# Patient Record
Sex: Male | Born: 1965 | Race: White | Hispanic: No | Marital: Married | Smoking: Former smoker
Health system: Southern US, Community
[De-identification: ages and names within clinical notes are randomized; demographics above are authoritative.]

## PROBLEM LIST (undated history)

## (undated) DIAGNOSIS — T7840XA Allergy, unspecified, initial encounter: Secondary | ICD-10-CM

## (undated) DIAGNOSIS — E785 Hyperlipidemia, unspecified: Secondary | ICD-10-CM

## (undated) DIAGNOSIS — G43909 Migraine, unspecified, not intractable, without status migrainosus: Secondary | ICD-10-CM

## (undated) DIAGNOSIS — J45998 Other asthma: Secondary | ICD-10-CM

## (undated) DIAGNOSIS — R51 Headache: Secondary | ICD-10-CM

## (undated) DIAGNOSIS — M5126 Other intervertebral disc displacement, lumbar region: Secondary | ICD-10-CM

## (undated) DIAGNOSIS — R519 Headache, unspecified: Secondary | ICD-10-CM

## (undated) HISTORY — PX: LASIK: SHX215

## (undated) HISTORY — DX: Migraine, unspecified, not intractable, without status migrainosus: G43.909

## (undated) HISTORY — DX: Headache: R51

## (undated) HISTORY — PX: SHOULDER SURGERY: SHX246

## (undated) HISTORY — PX: ACHILLES TENDON REPAIR: SUR1153

## (undated) HISTORY — DX: Hyperlipidemia, unspecified: E78.5

## (undated) HISTORY — DX: Headache, unspecified: R51.9

## (undated) HISTORY — DX: Allergy, unspecified, initial encounter: T78.40XA

## (undated) HISTORY — DX: Other intervertebral disc displacement, lumbar region: M51.26

---

## 1999-02-01 DIAGNOSIS — M5126 Other intervertebral disc displacement, lumbar region: Secondary | ICD-10-CM

## 1999-02-01 HISTORY — DX: Other intervertebral disc displacement, lumbar region: M51.26

## 2001-09-13 ENCOUNTER — Encounter: Admission: RE | Admit: 2001-09-13 | Discharge: 2001-09-13 | Payer: Self-pay | Admitting: Family Medicine

## 2001-09-13 ENCOUNTER — Encounter: Payer: Self-pay | Admitting: Family Medicine

## 2008-01-23 ENCOUNTER — Emergency Department (HOSPITAL_BASED_OUTPATIENT_CLINIC_OR_DEPARTMENT_OTHER): Admission: EM | Admit: 2008-01-23 | Discharge: 2008-01-23 | Payer: Self-pay | Admitting: Emergency Medicine

## 2008-01-23 ENCOUNTER — Ambulatory Visit: Payer: Self-pay | Admitting: Diagnostic Radiology

## 2012-12-08 ENCOUNTER — Emergency Department (HOSPITAL_BASED_OUTPATIENT_CLINIC_OR_DEPARTMENT_OTHER)
Admission: EM | Admit: 2012-12-08 | Discharge: 2012-12-08 | Disposition: A | Discharge status: Home / Self Care | Payer: BC Managed Care – PPO | Attending: Emergency Medicine | Admitting: Emergency Medicine

## 2012-12-08 ENCOUNTER — Emergency Department (HOSPITAL_BASED_OUTPATIENT_CLINIC_OR_DEPARTMENT_OTHER): Payer: BC Managed Care – PPO

## 2012-12-08 ENCOUNTER — Encounter (HOSPITAL_BASED_OUTPATIENT_CLINIC_OR_DEPARTMENT_OTHER): Payer: Self-pay | Admitting: Emergency Medicine

## 2012-12-08 DIAGNOSIS — R42 Dizziness and giddiness: Secondary | ICD-10-CM | POA: Insufficient documentation

## 2012-12-08 DIAGNOSIS — R079 Chest pain, unspecified: Secondary | ICD-10-CM

## 2012-12-08 DIAGNOSIS — J45909 Unspecified asthma, uncomplicated: Secondary | ICD-10-CM | POA: Insufficient documentation

## 2012-12-08 DIAGNOSIS — Z79899 Other long term (current) drug therapy: Secondary | ICD-10-CM | POA: Insufficient documentation

## 2012-12-08 DIAGNOSIS — R071 Chest pain on breathing: Secondary | ICD-10-CM | POA: Insufficient documentation

## 2012-12-08 HISTORY — DX: Other asthma: J45.998

## 2012-12-08 LAB — BASIC METABOLIC PANEL
BUN: 20 mg/dL (ref 6–23)
CO2: 25 mEq/L (ref 19–32)
Calcium: 9.7 mg/dL (ref 8.4–10.5)
Chloride: 102 mEq/L (ref 96–112)
Creatinine, Ser: 1.2 mg/dL (ref 0.50–1.35)
Sodium: 140 mEq/L (ref 135–145)

## 2012-12-08 LAB — CBC WITH DIFFERENTIAL/PLATELET
Basophils Relative: 0 % (ref 0–1)
Eosinophils Relative: 3 % (ref 0–5)
HCT: 44.7 % (ref 39.0–52.0)
Hemoglobin: 15.9 g/dL (ref 13.0–17.0)
Lymphocytes Relative: 32 % (ref 12–46)
MCH: 31.9 pg (ref 26.0–34.0)
MCHC: 35.6 g/dL (ref 30.0–36.0)
MCV: 89.6 fL (ref 78.0–100.0)
Monocytes Absolute: 0.7 10*3/uL (ref 0.1–1.0)
Monocytes Relative: 10 % (ref 3–12)
Neutro Abs: 4.1 10*3/uL (ref 1.7–7.7)
Platelets: 260 10*3/uL (ref 150–400)

## 2012-12-08 MED ORDER — IBUPROFEN 600 MG PO TABS: 600.0000 mg | ORAL_TABLET | Freq: Four times a day (QID) | ORAL | Status: DC | PRN

## 2012-12-08 MED ORDER — ASPIRIN 81 MG PO CHEW
162.0000 mg | CHEWABLE_TABLET | Freq: Once | ORAL | Status: AC
  Administered 2012-12-08: 162 mg via ORAL
  Filled 2012-12-08: qty 2

## 2012-12-08 MED ORDER — GI COCKTAIL ~~LOC~~
30.0000 mL | Freq: Once | ORAL | Status: AC
  Administered 2012-12-08: 30 mL via ORAL
  Filled 2012-12-08: qty 30

## 2012-12-08 NOTE — ED Notes (Signed)
Patient states he woke up around an hour ago with a sharp pain on L side of chest and SOB. Every time he takes a breath it hurts. States it feels like a pulled muscle

## 2012-12-08 NOTE — ED Provider Notes (Signed)
CSN: [id number]     Arrival date & time 12/08/12  4098 History   First MD Initiated Contact with Patient 12/08/12 [phone]     Chief Complaint  Patient presents with  . Chest Pain   (Consider location/radiation/quality/duration/timing/severity/associated sxs/prior Treatment) HPI Comments: Pt with no medical hx, surgical hx, substance abuse comes in with cc of chest pain. Pt woke up with a left sided chest pain, that is constant, and feels like a muscle pull, and is worse with inspiration. No n/dib/diophoresis/near syncope/syncope. Pain is non radiating. No trauma. No hx of PE, DVT and no risk factors for the same. No family hx of musculoskeletal disorders and no premature CAD.   Patient is a 47 y.o. male presenting with chest pain. The history is provided by the patient.  Chest Pain Associated symptoms: dizziness   Associated symptoms: no cough, no fever, no headache and no shortness of breath     Past Medical History  Diagnosis Date  . Seasonal asthma    Past Surgical History  Procedure Laterality Date  . Achilles tendon repair     History reviewed. No pertinent family history. History  Substance Use Topics  . Smoking status: Never Smoker   . Smokeless tobacco: Not on file  . Alcohol Use: No    Review of Systems  Constitutional: Negative for fever, chills and activity change.  Eyes: Negative for visual disturbance.  Respiratory: Negative for cough, chest tightness and shortness of breath.   Cardiovascular: Positive for chest pain.  Gastrointestinal: Negative for abdominal distention.  Genitourinary: Negative for dysuria, enuresis and difficulty urinating.  Musculoskeletal: Negative for arthralgias and neck pain.  Neurological: Positive for dizziness. Negative for light-headedness and headaches.  Psychiatric/Behavioral: Negative for confusion.    Allergies  Review of patient's allergies indicates no known allergies.  Home Medications   Current Outpatient Rx  Name  Route   Sig  Dispense  Refill  . ALBUTEROL IN   Inhalation   Inhale into the lungs as needed.         Marland Kitchen ibuprofen (ADVIL,MOTRIN) 200 MG tablet   Oral   Take 200 mg by mouth every 6 (six) hours as needed.         . SUMATRIPTAN SUCCINATE PO   Oral   Take by mouth as needed.          BP 137/86  Pulse 65  Temp(Src) 97.6 F (36.4 C)  Resp 18  SpO2 100% Physical Exam  Constitutional: He is oriented to person, place, and time. He appears well-developed.  HENT:  Head: Normocephalic and atraumatic.  Eyes: Conjunctivae and EOM are normal. Pupils are equal, round, and reactive to light.  Neck: Normal range of motion. Neck supple.  Cardiovascular: Normal rate and regular rhythm.   Pulmonary/Chest: Effort normal and breath sounds normal.  Pain worse with inspiration and palpation of the chest wall.  Abdominal: Soft. Bowel sounds are normal. He exhibits no distension. There is no tenderness. There is no rebound and no guarding.  Neurological: He is alert and oriented to person, place, and time.  Skin: Skin is warm.    ED Course  Procedures (including critical care time) Labs Review Labs Reviewed  CBC WITH DIFFERENTIAL  BASIC METABOLIC PANEL  TROPONIN I   Imaging Review No results found.  EKG Interpretation   None       MDM  No diagnosis found.  Pt comes in with cc of chest pain. Pt has 0 cardiac risk factors. Heart score to  be 1 if trops x 2 are negative - and so unless troponin is elevated, no concerns for immediate cardiac pathology and workup.  History  Slightly suspicious 0   ECG Normal 0   Age 61 - 65 years 1  Risk Factors  No risk factors known 0   Troponin  ? normal limit 0  We also screened patient for PE (PERC neg, WELLS score of 0) No neuro deficits, no radiating, severe chest pain or risk factors that would heighten the suspicion for dissection.  Will get trops x 2 and [facility] f/u.      Derwood Kaplan, MD 12/08/12 [phone]

## 2013-03-12 ENCOUNTER — Ambulatory Visit: Payer: BC Managed Care – PPO | Admitting: Nurse Practitioner

## 2013-11-21 ENCOUNTER — Ambulatory Visit (HOSPITAL_BASED_OUTPATIENT_CLINIC_OR_DEPARTMENT_OTHER)
Admission: RE | Admit: 2013-11-21 | Discharge: 2013-11-21 | Disposition: A | Discharge status: Home / Self Care | Payer: BC Managed Care – PPO | Source: Ambulatory Visit | Attending: Nurse Practitioner | Admitting: Nurse Practitioner

## 2013-11-21 ENCOUNTER — Ambulatory Visit (INDEPENDENT_AMBULATORY_CARE_PROVIDER_SITE_OTHER): Payer: BC Managed Care – PPO | Admitting: Nurse Practitioner

## 2013-11-21 ENCOUNTER — Other Ambulatory Visit: Payer: Self-pay | Admitting: Nurse Practitioner

## 2013-11-21 ENCOUNTER — Encounter: Payer: Self-pay | Admitting: Nurse Practitioner

## 2013-11-21 VITALS — BP 140/90 | HR 79 | Temp 98.2°F | Ht 73.0 in | Wt 205.0 lb

## 2013-11-21 DIAGNOSIS — R1030 Lower abdominal pain, unspecified: Secondary | ICD-10-CM

## 2013-11-21 DIAGNOSIS — R29898 Other symptoms and signs involving the musculoskeletal system: Secondary | ICD-10-CM

## 2013-11-21 DIAGNOSIS — G43009 Migraine without aura, not intractable, without status migrainosus: Secondary | ICD-10-CM

## 2013-11-21 DIAGNOSIS — N410 Acute prostatitis: Secondary | ICD-10-CM

## 2013-11-21 DIAGNOSIS — R102 Pelvic and perineal pain: Secondary | ICD-10-CM | POA: Insufficient documentation

## 2013-11-21 DIAGNOSIS — M549 Dorsalgia, unspecified: Secondary | ICD-10-CM | POA: Insufficient documentation

## 2013-11-21 LAB — POCT URINALYSIS DIPSTICK
BILIRUBIN UA: NEGATIVE
Glucose, UA: NEGATIVE
Ketones, UA: NEGATIVE
Leukocytes, UA: NEGATIVE
NITRITE UA: NEGATIVE
PH UA: 6.5
PROTEIN UA: NEGATIVE
RBC UA: NEGATIVE
Spec Grav, UA: 1.02
UROBILINOGEN UA: 0.2

## 2013-11-21 LAB — CBC WITH DIFFERENTIAL/PLATELET
Basophils Absolute: 0 10*3/uL (ref 0.0–0.1)
Basophils Relative: 0.5 % (ref 0.0–3.0)
EOS PCT: 1 % (ref 0.0–5.0)
Eosinophils Absolute: 0.1 10*3/uL (ref 0.0–0.7)
HCT: 47.8 % (ref 39.0–52.0)
HEMOGLOBIN: 16 g/dL (ref 13.0–17.0)
Lymphocytes Relative: 28.1 % (ref 12.0–46.0)
Lymphs Abs: 2.5 10*3/uL (ref 0.7–4.0)
MCHC: 33.6 g/dL (ref 30.0–36.0)
MCV: 95.9 fl (ref 78.0–100.0)
MONOS PCT: 8.3 % (ref 3.0–12.0)
Monocytes Absolute: 0.7 10*3/uL (ref 0.1–1.0)
Neutro Abs: 5.6 10*3/uL (ref 1.4–7.7)
Neutrophils Relative %: 62.1 % (ref 43.0–77.0)
Platelets: 323 10*3/uL (ref 150.0–400.0)
RBC: 4.98 Mil/uL (ref 4.22–5.81)
RDW: 13.6 % (ref 11.5–15.5)
WBC: 9 10*3/uL (ref 4.0–10.5)

## 2013-11-21 LAB — RHEUMATOID FACTOR

## 2013-11-21 LAB — SEDIMENTATION RATE: Sed Rate: 3 mm/hr (ref 0–22)

## 2013-11-21 MED ORDER — KETOROLAC TROMETHAMINE 60 MG/2ML IM SOLN
60.0000 mg | Freq: Once | INTRAMUSCULAR | Status: AC
  Administered 2013-11-21: 60 mg via INTRAMUSCULAR

## 2013-11-21 MED ORDER — SUMATRIPTAN SUCCINATE 100 MG PO TABS: 100.0000 mg | ORAL_TABLET | Freq: Once | ORAL | Status: DC

## 2013-11-21 MED ORDER — CIPROFLOXACIN HCL 250 MG PO TABS: 250.0000 mg | ORAL_TABLET | Freq: Two times a day (BID) | ORAL | Status: DC

## 2013-11-21 NOTE — Progress Notes (Signed)
Subjective:     Warren Hall is a 48 y.o. male presents to establish care. He c/o multiple symptoms: bilateral groin pain for 2 weeks worse with sitting, urination, & erection. Associated symptoms: urinary frequency, full sensation in rectum & perineum, low back pain, intermittent chills & fatigue. He denies malodorous or discolored urine, & unprotected intercourse within at least 6 mos. He is also concerned about an episode that occurred 1 month ago while driving: He describes sudden "wave" of dysphoria accompanied by dizziness & weakness as though he might pass out. He pulled off of highway & drove to urgent care. He reports ECG, BP, & glucose all normal. After few hours, he felt as though he could drive, although did not feel "normal" for about 24 hours. He denies subsequent episodes, but does have occasional dizziness w/nausea. He also c/o L leg weakness & paresthesia in bottom of foot that has been problem for 15 years, but has become more frequent & intense in last 6 mos. He has decreased exercise due to pain. He was told he had herniated lumbar disc 68 ya, treated conservatively with activity modification, PT & chiropractor. He denies bowel or bladder incontinence.  He c/o HA that lasts 24 to 48 hrs, occuring about 1/week. OTC meds decrease pain, but do not relieve pain. Pain usually located behind R eye. No associated auras. Used ex-wife's imitrex once, w/complete relief of pain.  Contributory Fam Hx: MGF had MS.    The following portions of the patient's history were reviewed and updated as appropriate: allergies, current medications, past family history, past medical history, past social history, past surgical history and problem list.  Review of Systems Pertinent items are noted in HPI.    Objective:    BP 140/90  Pulse 79  Temp(Src) 98.2 F (36.8 C) (Temporal)  Ht '6\' 1"'  (1.854 m)  Wt 205 lb (92.987 kg)  BMI 27.05 kg/m2  SpO2 96% BP 140/90  Pulse 79  Temp(Src) 98.2 F (36.8 C)  (Temporal)  Ht '6\' 1"'  (1.854 m)  Wt 205 lb (92.987 kg)  BMI 27.05 kg/m2  SpO2 96% General appearance: alert, cooperative, appears stated age and no distress Head: Normocephalic, without obvious abnormality, atraumatic Eyes: negative findings: lids and lashes normal, conjunctivae and sclerae normal, corneas clear, pupils equal, round, reactive to light and accomodation and visual fields full to confrontation Ears: normal TM's and external ear canals both ears Throat: lips, mucosa, and tongue normal; teeth and gums normal Back: no kyphosis present, no scoliosis present, no skin lesions, erythema, or scars, no tenderness to percussion or palpation, range of motion normal, bilat CVA tenderness, worse on L Lungs: clear to auscultation bilaterally Heart: regular rate and rhythm, S1, S2 normal, no murmur, click, rub or gallop Abdomen: normal findings: no masses palpable and no organomegaly and abnormal findings:  diffusely tender, no guarding  Rectal: normal sphincter tone, prostate feels boggy & enlarged Extremities: extremities normal, atraumatic, no cyanosis or edema and no signs of radicular back pain: ned sitting & straight leg raise. Pulses: 2+ and symmetric Neurologic: Mental status: Alert, oriented, thought content appropriate Cranial nerves: normal Motor: grossly normal Coordination: test for rapid alternating movements normal Gait: Normal    Assessment:   1. Left leg weakness & L foot paresthesia - HLA-B27 antigen - CBC with Differential - Comprehensive metabolic panel - HIV antibody - TSH - T4, free - Sedimentation rate - RPR - Antinuclear Antibodies, IFA - Vitamin B12 - Rheumatoid factor - DG Lumbar Spine  Complete; Future  2. Groin pain, unspecified laterality - POCT urinalysis dipstick - Urine culture - GC/chlamydia probe amp, urine - HLA-B27 antigen - PSA - CBC with Differential - Comprehensive metabolic panel - HIV antibody - TSH - T4, free - Sedimentation  rate - US Renal; Future - RPR - ketorolac (TORADOL) injection 60 mg; Inject 2 mLs (60 mg total) into the muscle once.  3. Acute prostatitis - ciprofloxacin (CIPRO) 250 MG tablet; Take 1 tablet (250 mg total) by mouth 2 (two) times daily.  Dispense: 20 tablet; Refill: 0  4. Migraine without aura and without status migrainosus, not intractable - SUMAtriptan (IMITREX) 100 MG tablet; Take 1 tablet (100 mg total) by mouth once. May repeat in 2 hours if headache persists or recurs.  Dispense: 10 tablet; Refill: 0  Today I will r/o kidney stone, treat for prostatitis, get lumbar xray to eval L leg weakness & f/u Hx of "herniated disc". Dysphoria, leg weakness, dizziness, HA could be MS-will r/o syphillis, & screen for connective tissue Ds (ANA, RF, HLA-B27, sed rate). If all nml findings, will ref to neuro for further eval of MS. F/u 10 days to eval groin pain.

## 2013-11-21 NOTE — Progress Notes (Signed)
Pre visit review using our clinic review tool, if applicable. No additional management support is needed unless otherwise documented below in the visit note. 

## 2013-11-21 NOTE — Patient Instructions (Signed)
Start antibiotic today.  Get renal ultrasound to determine if you have a kidney stone.  Get back xrays.  My office will call with results of labs & xrays.  Please follow up in 10 days.  Nice to see you.

## 2013-11-22 ENCOUNTER — Telehealth: Payer: Self-pay | Admitting: Nurse Practitioner

## 2013-11-22 LAB — PSA: PSA: 0.93 ng/mL (ref 0.10–4.00)

## 2013-11-22 LAB — COMPREHENSIVE METABOLIC PANEL
ALBUMIN: 4.2 g/dL (ref 3.5–5.2)
ALK PHOS: 48 U/L (ref 39–117)
ALT: 14 U/L (ref 0–53)
AST: 22 U/L (ref 0–37)
BILIRUBIN TOTAL: 0.8 mg/dL (ref 0.2–1.2)
BUN: 16 mg/dL (ref 6–23)
CO2: 22 meq/L (ref 19–32)
Calcium: 10 mg/dL (ref 8.4–10.5)
Chloride: 109 mEq/L (ref 96–112)
Creatinine, Ser: 1.1 mg/dL (ref 0.4–1.5)
GFR: 76.16 mL/min (ref >60.00)
GLUCOSE: 86 mg/dL (ref 70–99)
Potassium: 4.3 mEq/L (ref 3.5–5.1)
SODIUM: 141 meq/L (ref 135–145)
TOTAL PROTEIN: 7.5 g/dL (ref 6.0–8.3)

## 2013-11-22 LAB — T4, FREE: Free T4: 1.05 ng/dL (ref 0.60–1.60)

## 2013-11-22 LAB — URINE CULTURE
Colony Count: NO GROWTH
Organism ID, Bacteria: NO GROWTH

## 2013-11-22 LAB — HIV ANTIBODY (ROUTINE TESTING W REFLEX): HIV: NONREACTIVE

## 2013-11-22 LAB — TSH: TSH: 1.45 u[IU]/mL (ref 0.35–4.50)

## 2013-11-22 LAB — RPR

## 2013-11-22 LAB — VITAMIN B12: Vitamin B-12: 421 pg/mL (ref 211–911)

## 2013-11-22 NOTE — Telephone Encounter (Signed)
LMOVM for pt to return call 

## 2013-11-22 NOTE — Telephone Encounter (Signed)
pls call pt: Ask if toradol helped pain yesterday. Advise still waiting on all blood work, so far looks like he may have mild infection as white cells slightly elevated. No kidney stones. Prostate on larger end of nml. He has some spurring of vertebrae, which is usually wear & tear arthritis. I'd like for him to see [facility] for further w/u of back pain & leg numbness. If he is agreeable, I will place referral. Continue instructions given in office & keep f/u appt. W/me.

## 2013-11-23 LAB — VITAMIN D 25 HYDROXY (VIT D DEFICIENCY, FRACTURES): VIT D 25 HYDROXY: 31 ng/mL (ref 30–89)

## 2013-11-23 LAB — GC/CHLAMYDIA PROBE AMP, URINE
Chlamydia, Swab/Urine, PCR: NEGATIVE
GC Probe Amp, Urine: NEGATIVE

## 2013-11-25 LAB — HLA-B27 ANTIGEN: DNA Result:: NOT DETECTED

## 2013-11-25 NOTE — Telephone Encounter (Addendum)
Patient stopped by office and was given results. Patient stated that he would like to go ahead with referral. Patient also stated that he could not tell a difference since taking the Toradol injection.

## 2013-11-25 NOTE — Telephone Encounter (Signed)
LMOVM 2nd message. 

## 2013-11-26 LAB — ANTINUCLEAR ANTIBODIES, IFA: ANA Titer 1: NEGATIVE

## 2013-11-27 ENCOUNTER — Encounter: Payer: Self-pay | Admitting: Nurse Practitioner

## 2013-11-27 ENCOUNTER — Ambulatory Visit (INDEPENDENT_AMBULATORY_CARE_PROVIDER_SITE_OTHER): Payer: BC Managed Care – PPO | Admitting: Nurse Practitioner

## 2013-11-27 ENCOUNTER — Telehealth: Payer: Self-pay | Admitting: Nurse Practitioner

## 2013-11-27 VITALS — BP 124/80 | HR 77 | Temp 97.2°F | Ht 73.0 in | Wt 207.0 lb

## 2013-11-27 DIAGNOSIS — M4712 Other spondylosis with myelopathy, cervical region: Secondary | ICD-10-CM

## 2013-11-27 DIAGNOSIS — N410 Acute prostatitis: Secondary | ICD-10-CM

## 2013-11-27 DIAGNOSIS — K219 Gastro-esophageal reflux disease without esophagitis: Secondary | ICD-10-CM | POA: Insufficient documentation

## 2013-11-27 DIAGNOSIS — M4716 Other spondylosis with myelopathy, lumbar region: Secondary | ICD-10-CM

## 2013-11-27 DIAGNOSIS — R12 Heartburn: Secondary | ICD-10-CM

## 2013-11-27 DIAGNOSIS — J45998 Other asthma: Secondary | ICD-10-CM | POA: Insufficient documentation

## 2013-11-27 DIAGNOSIS — Z0000 Encounter for general adult medical examination without abnormal findings: Secondary | ICD-10-CM

## 2013-11-27 DIAGNOSIS — Z23 Encounter for immunization: Secondary | ICD-10-CM

## 2013-11-27 LAB — POCT URINALYSIS DIPSTICK
Bilirubin, UA: NEGATIVE
Blood, UA: NEGATIVE
Glucose, UA: NEGATIVE
Ketones, UA: NEGATIVE
Leukocytes, UA: NEGATIVE
Nitrite, UA: NEGATIVE
Protein, UA: NEGATIVE
SPEC GRAV UA: 1.01
Urobilinogen, UA: 0.2
pH, UA: 6

## 2013-11-27 MED ORDER — METHYLPREDNISOLONE ACETATE 40 MG/ML IJ SUSP
40.0000 mg | Freq: Once | INTRAMUSCULAR | Status: AC
  Administered 2013-11-27: 40 mg via INTRAMUSCULAR

## 2013-11-27 MED ORDER — ALBUTEROL SULFATE HFA 108 (90 BASE) MCG/ACT IN AERS: 2.0000 | INHALATION_SPRAY | RESPIRATORY_TRACT | Status: DC | PRN

## 2013-11-27 MED ORDER — PREDNISONE 10 MG PO TABS: ORAL_TABLET | ORAL | Status: DC

## 2013-11-27 NOTE — Assessment & Plan Note (Signed)
Complete 10 days cipro Mild improvement of groin pressure after 6 days.

## 2013-11-27 NOTE — Assessment & Plan Note (Addendum)
Refer to [facility] Depo medrol in ofc today Start prednisone taper tomorrow. Vit d level F/u 2 wks

## 2013-11-27 NOTE — Assessment & Plan Note (Signed)
Depo-med today Start pred taper tomorrow Ref to [facility].

## 2013-11-27 NOTE — Assessment & Plan Note (Signed)
Needs tdap & flu-admin today Lipid screen

## 2013-11-27 NOTE — Telephone Encounter (Signed)
Patient notified of results.

## 2013-11-27 NOTE — Progress Notes (Signed)
Subjective:     Warren Hall is a 48 y.o. male presents for follow up of back pain that radiates to L leg & heel, chronic HA, groin & rectal pressure, intermittent decreased urine stream, and episode of sudden dysphoria that lasted about 8 hrs followed by 24 hrs of "not feeling right". He also wishes to have preventive care screening.  At last OV, he had all complaints above. My suspicions were kidney stone, prostatitis, radicular back pain. W/u included: renal US- neg; prostate exam -boggy/ good rectal tone so I started treatment for prostatitis-cipro 10 days; ordered lumbar back xrays-narrowing at L1-2, 3-4, L5-s and anterior spurring at L1.The pt was concerned about MS causing symptoms as his MGF had MS, so I began r/o process: screened for thyroid disease, syphillis, HIV, AI disease-all neg including RPR, HIV, ANA, RF, HLA-B27. Treatment in office included 60 mg toradol. Pt reports did not help groin discomfort.  Today pt reports groin & rectal pressure is somewhat improved, but still having intermittent weak urine stream & dysuria, low back pain that radiates to L leg & L heel-worse with prolonged sitting & playing tennis, Neck discomfort & HA persistent. He also mentions feeling "queezy in mornings & having decreased appetite although weight is up 2 lbs. Since last visit.  I reviewed CT scans of cervical & lumbar spine form 2009: both show spondylosis. This is likely progressive & may be causing all symptoms. (Boggy prostate is subjective). I reviewed preventive care issues: needs tdap & flu vaccines. BMI healthy, BP nml.  Family Hx reviewed at last visit. Social & Medical Hx completed today.  Review of Systems Constitutional: positive for "just don't feel right" Ears, nose, mouth, throat, and face: positive for ears feel stuffy, L>R Respiratory: negative Gastrointestinal: positive for reflux symptoms and relieved by pepcid Genitourinary:positive for decreased stream, dysuria and pressure &  pain in groin & rectum not relieved by BM or urination Musculoskeletal:positive for back pain, neck pain and L heel pain, HA Neurological: positive for dizziness, headaches and 1 episode of dysphoria described above Allergic/Immunologic: positive for spring allergies    Objective:    BP 124/80  Pulse 77  Temp(Src) 97.2 F (36.2 C) (Temporal)  Ht _0  (1.854 m)  Wt 207 lb (93.895 kg)  BMI 27.32 kg/m2  SpO2 98% BP 124/80  Pulse 77  Temp(Src) 97.2 F (36.2 C) (Temporal)  Ht _1  (1.854 m)  Wt 207 lb (93.895 kg)  BMI 27.32 kg/m2  SpO2 98% General appearance: alert, cooperative, appears stated age and no distress Head: Normocephalic, without obvious abnormality, atraumatic Eyes: negative findings: lids and lashes normal and conjunctivae and sclerae normal Ears: normal TM's and external ear canals both ears Lymph nodes: Cervical, supraclavicular, and axillary nodes normal.    Assessment:  1. Lumbar spondylosis with myelopathy - methylPREDNISolone acetate (DEPO-MEDROL) injection 40 mg; Inject 1 mL (40 mg total) into the muscle once. - Vit D  25 hydroxy (rtn osteoporosis monitoring); Future - predniSONE (DELTASONE) 10 MG tablet; Starting tomorrow, Take 4Tpo qam X 2d, then 3T po qam X 3d, then 2T po qd X 4d, then 1T po qam X 4d.  Dispense: 29 tablet; Refill: 0  2. Spondylosis, cervical, with myelopathy - methylPREDNISolone acetate (DEPO-MEDROL) injection 40 mg; Inject 1 mL (40 mg total) into the muscle once. - Vit D  25 hydroxy (rtn osteoporosis monitoring); Future - predniSONE (DELTASONE) 10 MG tablet; Starting tomorrow, Take 4Tpo qam X 2d, then 3T po qam X 3d, then  2T po qd X 4d, then 1T po qam X 4d.  Dispense: 29 tablet; Refill: 0  3. Seasonal asthma - albuterol (PROVENTIL HFA;VENTOLIN HFA) 108 (90 BASE) MCG/ACT inhaler; Inhale 2 puffs into the lungs every 4 (four) hours as needed for wheezing.  Dispense: 1 Inhaler; Refill: 1  4. Need for Tdap vaccination - Tdap vaccine  greater than or equal to 7yo IM  5. Need for prophylactic vaccination and inoculation against influenza - Flu Vaccine QUAD 36+ mos IM  6. Preventative health care - Comprehensive metabolic panel; Future - Lipid panel; Future - POCT urinalysis dipstick  7. Heartburn - H. pylori antibody, IgG; Future  See problem list for complete A&P See pt instructions. F/u  2 wks

## 2013-11-27 NOTE — Patient Instructions (Signed)
Start prednisone tomorrow. Take in morning. Do not take ibuprophen while taking prednisone. Use heat on back & neck.  Continue antibiotics. Eat yogurt daily to help prevent antibiotic -associated diarrhea.  Take enough B complex to get at least 1200 mcg B12 daily.  Follow up with me in 2 weeks. We will do fasting labs then & get employer form signed.  See [facility].

## 2013-11-27 NOTE — Assessment & Plan Note (Signed)
pepcid relieves H pylori Start probiotics.

## 2013-11-27 NOTE — Telephone Encounter (Signed)
pls call pt: Advise Vitamin D low. Start 1000 iu D3 daily-buy OTC.

## 2013-11-27 NOTE — Progress Notes (Signed)
Pre visit review using our clinic review tool, if applicable. No additional management support is needed unless otherwise documented below in the visit note. 

## 2013-12-02 ENCOUNTER — Ambulatory Visit: Payer: BC Managed Care – PPO | Admitting: Nurse Practitioner

## 2013-12-02 ENCOUNTER — Ambulatory Visit (INDEPENDENT_AMBULATORY_CARE_PROVIDER_SITE_OTHER): Payer: BC Managed Care – PPO | Admitting: Nurse Practitioner

## 2013-12-02 ENCOUNTER — Encounter: Payer: Self-pay | Admitting: Nurse Practitioner

## 2013-12-02 VITALS — BP 133/82 | HR 89 | Temp 97.7°F | Ht 73.0 in | Wt 203.0 lb

## 2013-12-02 DIAGNOSIS — M5137 Other intervertebral disc degeneration, lumbosacral region: Secondary | ICD-10-CM | POA: Insufficient documentation

## 2013-12-02 DIAGNOSIS — N400 Enlarged prostate without lower urinary tract symptoms: Secondary | ICD-10-CM | POA: Insufficient documentation

## 2013-12-02 DIAGNOSIS — N401 Enlarged prostate with lower urinary tract symptoms: Secondary | ICD-10-CM

## 2013-12-02 DIAGNOSIS — R3912 Poor urinary stream: Secondary | ICD-10-CM

## 2013-12-02 DIAGNOSIS — M47812 Spondylosis without myelopathy or radiculopathy, cervical region: Secondary | ICD-10-CM | POA: Insufficient documentation

## 2013-12-02 MED ORDER — TADALAFIL 5 MG PO TABS: 5.0000 mg | ORAL_TABLET | ORAL | Status: DC

## 2013-12-02 NOTE — Patient Instructions (Addendum)
Start cialis. Take at same time every day without regard to sexual activity. Follow up by phone in 2 -3 weeks, if no improvement in symptoms, I will refer you to urology. We will check on spine referral. You should hear back from this office today.  Benign Prostatic Hypertrophy The prostate gland is part of the reproductive system of men. A normal prostate is about the size and shape of a walnut. The prostate gland produces a fluid that is mixed with sperm to make semen. This gland surrounds the urethra and is located in front of the rectum and just below the bladder. The bladder is where urine is stored. The urethra is the tube through which urine passes from the bladder to get out of the body. The prostate grows as a man ages. An enlarged prostate not caused by cancer is called benign prostatic hypertrophy (BPH). An enlarged prostate can press on the urethra. This can make it harder to pass urine. In the early stages of enlargement, the bladder can get by with a narrowed urethra by forcing the urine through. If the problem gets worse, medical or surgical treatment may be required.  This condition should be followed by your health care provider. The accumulation of urine in the bladder can cause infection. Back pressure and infection can progress to bladder damage and kidney (renal) failure. If needed, your health care provider may refer you to a specialist in kidney and prostate disease (urologist). CAUSES  BPH is a common health problem in men older than 50 years. This condition is a normal part of aging. However, not all men will develop problems from this condition. If the enlargement grows away from the urethra, then there will not be any compression of the urethra and resistance to urine flow.If the growth is toward the urethra and compresses it, you will experience difficulty urinating.  SYMPTOMS   Not able to completely empty your bladder.  Getting up often during the night to urinate.  Need  to urinate frequently during the day.  Difficultly starting urine flow.  Decrease in size and strength of your urine stream.  Dribbling after urination.  Pain on urination (more common with infection).  Inability to pass urine. This needs immediate treatment.  The development of a urinary tract infection. DIAGNOSIS  These tests will help your health care provider understand your problem:  A thorough history and physical examination.  A urination history, with the number of times you urinate, the amounts of urine, the strength of the urine stream, and the feeling of emptiness or fullness after urinating.  A postvoid bladder scan that measures any amount of urine that may remain in your bladder after you finish urinating.  Digital rectal exam. In a rectal exam, your health care provider checks your prostate by putting a gloved, lubricated finger into your rectum to feel the back of your prostate gland. This exam detects the size of your gland and abnormal lumps or growths.  Exam of your urine (urinalysis).  Prostate specific antigen (PSA) screening. This is a blood test used to screen for prostate cancer.  Rectal ultrasonography. This test uses sound waves to electronically produce a picture of your prostate gland. TREATMENT  Once symptoms begin, your health care provider will monitor your condition. Of the men with this condition, one third will have symptoms that stabilize, one third will have symptoms that improve, and one third will have symptoms that progress in the first year. Mild symptoms may not need treatment. Simple  observation and yearly exams may be all that is required. Medicines and surgery are options for more severe problems. Your health care provider can help you make an informed decision for what is best. Two classes of medicines are available for relief of prostate symptoms:  Medicines that shrink the prostate. This helps relieve symptoms. These medicines take time  to work, and it may be months before any improvement is seen.  Uncommon side effects include problems with sexual function.  Medicines to relax the muscle of the prostate. This also relieves the obstruction by reducing any compression on the urethra.This group of medicines work much faster than those that reduce the size of the prostate gland. Usually, one can experience improvement in days to weeks..  Side effects can include dizziness, fatigue, lightheadedness, and retrograde ejaculation (diminished volume of ejaculate). Several types of surgical treatments are available for relief of prostate symptoms:  Transurethral resection of the prostate (TURP)--In this treatment, an instrument is inserted through opening at the tip of the penis. It is used to cut away pieces of the inner core of the prostate. The pieces are removed through the same opening of the penis. This removes the obstruction and helps get rid of the symptoms.  Transurethral incision (TUIP)--In this procedure, small cuts are made in the prostate. This lessens the prostates pressure on the urethra.  Transurethral microwave thermotherapy (TUMT)--This procedure uses microwaves to create heat. The heat destroys and removes a small amount of prostate tissue.  Transurethral needle ablation (TUNA)--This is a procedure that uses radio frequencies to do the same as TUMT.  Interstitial laser coagulation (ILC)--This is a procedure that uses a laser to do the same as TUMT and TUNA.  Transurethral electrovaporization (TUVP)--This is a procedure that uses electrodes to do the same as the procedures listed above. SEEK MEDICAL CARE IF:   You develop a fever.  There is unexplained back pain.  Symptoms are not helped by medicines prescribed.  You develop side effects from the medicine you are taking.  Your urine becomes very dark or has a bad smell.  Your lower abdomen becomes distended and you have difficulty passing your urine. SEEK  IMMEDIATE MEDICAL CARE IF:   You are suddenly unable to urinate. This is an emergency. You should be seen immediately.  There are large amounts of blood or clots in the urine.  Your urinary problems become unmanageable.  You develop lightheadedness, severe dizziness, or you feel faint.  You develop moderate to severe low back or flank pain.  You develop chills or fever. Document Released: 01/17/2005 Document Revised: 01/22/2013 Document Reviewed: 08/02/2012 [facility] Patient Information 2015 [place], [place]. This information is not intended to replace advice given to you by your health care provider. Make sure you discuss any questions you have with your health care provider.  Start

## 2013-12-02 NOTE — Progress Notes (Signed)
Pre visit review using our clinic review tool, if applicable. No additional management support is needed unless otherwise documented below in the visit note. 

## 2013-12-02 NOTE — Progress Notes (Signed)
Subjective:     Warren Hall is a 48 y.o. male here for evaluation of symptoms of BPH & F/u of chronic HA & lumbar pain that radiates to Bilat LE: L>R. Onset of urinary symptoms was several weeks ago and may have been gradual-Warren Hall is recently divorced-not sexually active for many months, but has started dating & discovered that he is having problems maintaining erection. Symptoms include incomplete emptying, straining, weak stream and unable to achieve erection, sustain erection., groin & rectal pressure. He denies urgency, flank pain, gross hematuria, kidney stones and recurrent UTI. On exam 2 weeks ago, he had nml rectal tone, but enlarged boggy prostate. PSA nml range, urine culture no growth. Renal US NMl. He completed a 10 day course of cipro for prostatitis w/no improvement.  Re chronic HA, lower & upper back problems: CT findings reveal cervical spondylosis & xray shows lumbar degenerative disc disease. Lumbar pain radiates to bilat LE-L>R. He just completed a course of prednisone that relieved daily HA, but did not improve lumbar pain, L heel pain, groin pain & pressure. Clearly, xrays show he has degenerative changes in spine that are likely causing chronic ha & low back pain. I wonder about spondyloarthropathy, although HLA-B27 neg.  I'm not sure how much BPH is contributing to back pain, & groin pressure. I will begin treatment today for BPH & will refer to urology if no improvement in weak stream & erectile dysfunction.  I will refer to [facility] to eval cervical spondylosis & lumbar deg disc disease.    The following portions of the patient's history were reviewed and updated as appropriate: allergies, current medications, past medical history, past social history, past surgical history and problem list.  Review of Systems Pertinent items are noted in HPI.    Objective:    BP 133/82 mmHg  Pulse 89  Temp(Src) 97.7 F (36.5 C) (Temporal)  Ht '6\' 1"'   (1.854 m)  Wt 203 lb (92.08 kg)  BMI 26.79 kg/m2  SpO2 97% General appearance: alert, cooperative, appears stated age and no distress Head: Normocephalic, without obvious abnormality, atraumatic Eyes: negative findings: lids and lashes normal and conjunctivae and sclerae normal Neurologic: Gait: Normal    Assessment:  1. Benign prostatic hypertrophy with weak urinary stream - tadalafil (CIALIS) 5 MG tablet; Take 1 tablet (5 mg total) by mouth daily.  Dispense: 30 tablet; Refill: 1  2. Cervical spondylosis - Ambulatory referral to Spine Surgery  3. Degeneration of lumbar or lumbosacral intervertebral disc - Ambulatory referral to Spine Surgery  Pt to ret for fasting labs Will call in 2-3 weeks to let me know if daily cialis is improving symptoms. Will refer to urology if no/minimal improvement.

## 2013-12-10 ENCOUNTER — Other Ambulatory Visit: Payer: Self-pay | Admitting: Orthopedic Surgery

## 2013-12-10 DIAGNOSIS — M5416 Radiculopathy, lumbar region: Secondary | ICD-10-CM

## 2013-12-11 ENCOUNTER — Encounter: Payer: Self-pay | Admitting: Nurse Practitioner

## 2013-12-11 ENCOUNTER — Ambulatory Visit (INDEPENDENT_AMBULATORY_CARE_PROVIDER_SITE_OTHER): Payer: BC Managed Care – PPO | Admitting: Nurse Practitioner

## 2013-12-11 VITALS — BP 133/84 | HR 65 | Temp 98.2°F | Resp 18 | Ht 73.0 in | Wt 205.0 lb

## 2013-12-11 DIAGNOSIS — K219 Gastro-esophageal reflux disease without esophagitis: Secondary | ICD-10-CM

## 2013-12-11 DIAGNOSIS — Z0000 Encounter for general adult medical examination without abnormal findings: Secondary | ICD-10-CM

## 2013-12-11 DIAGNOSIS — R12 Heartburn: Secondary | ICD-10-CM

## 2013-12-11 DIAGNOSIS — N401 Enlarged prostate with lower urinary tract symptoms: Secondary | ICD-10-CM

## 2013-12-11 DIAGNOSIS — G43009 Migraine without aura, not intractable, without status migrainosus: Secondary | ICD-10-CM

## 2013-12-11 DIAGNOSIS — M5440 Lumbago with sciatica, unspecified side: Secondary | ICD-10-CM

## 2013-12-11 DIAGNOSIS — R3912 Poor urinary stream: Secondary | ICD-10-CM

## 2013-12-11 DIAGNOSIS — N529 Male erectile dysfunction, unspecified: Secondary | ICD-10-CM

## 2013-12-11 LAB — LIPID PANEL
CHOL/HDL RATIO: 5
CHOLESTEROL: 262 mg/dL — AB (ref 0–200)
HDL: 47.7 mg/dL (ref >39.00)
NONHDL: 214.3
TRIGLYCERIDES: 242 mg/dL — AB (ref 0.0–149.0)
VLDL: 48.4 mg/dL — ABNORMAL HIGH (ref 0.0–40.0)

## 2013-12-11 LAB — H. PYLORI ANTIBODY, IGG: H Pylori IgG: NEGATIVE

## 2013-12-11 MED ORDER — OMEPRAZOLE 40 MG PO CPDR: 40.0000 mg | DELAYED_RELEASE_CAPSULE | Freq: Every day | ORAL | Status: DC

## 2013-12-11 MED ORDER — TADALAFIL 10 MG PO TABS: 10.0000 mg | ORAL_TABLET | ORAL | Status: DC | PRN

## 2013-12-11 MED ORDER — SUMATRIPTAN SUCCINATE 100 MG PO TABS: 100.0000 mg | ORAL_TABLET | Freq: Once | ORAL | Status: DC

## 2013-12-11 NOTE — Patient Instructions (Signed)
Start increased dose of Cialis. Take before sexual activity. You may double dose if needed to 20 mg. See urology for evaluation.  Start omeprazole. Stop pepcid. Start probiotic. Take daily.  Take vitamin D3 2000 iu daily with meal.  We will call with lab results.  See you in 1 month to evaluate reflux.

## 2013-12-11 NOTE — Progress Notes (Signed)
Pre visit review using our clinic review tool, if applicable. No additional management support is needed unless otherwise documented below in the visit note. 

## 2013-12-12 ENCOUNTER — Telehealth: Payer: Self-pay | Admitting: Nurse Practitioner

## 2013-12-12 LAB — LDL CHOLESTEROL, DIRECT: Direct LDL: 170.5 mg/dL

## 2013-12-12 NOTE — Telephone Encounter (Signed)
pls call pt: Advise Need to schedule appt to discuss cholesterol profile & diet changes to decrease risk for heart disease.  Pls fill out his employer form & let him know he can pick up.

## 2013-12-13 NOTE — Telephone Encounter (Signed)
Patient notified. Also that form is ready for pick-up.

## 2013-12-13 NOTE — Progress Notes (Signed)
Subjective:     Warren Hall is a 48 y.o. male presents for f/u of frequent HA, weak urine stream & ED, & new c/o reflux. He states he continues to not "feel well". He had evaluation w/[facility]: He has MRI pending of L-S. He was started on gabapentin-cannot tell it is helping w/heel paresthesia.  HA: "bad HA" occurs weekly, relieved by 1 dose imitrex. Requests refill. No aura. ED & weak urine stream: Two weeks of 5 mg cialis has improved urine stream, but not helping w/ED. Continues to c/o rectal & groin pressure-not sure if this is r/t to GU or back patho.  Prior Tx: 10 dys cipro. W/U: Prostate exam-boggy, renal US-nml, PSA nml. No cardiac Hx, will check lipids today. Reflux: worse since taking prednisone. Taking pepcid twice daily w/mild relief of symptoms.   The following portions of the patient's history were reviewed and updated as appropriate: allergies, current medications, past medical history, past social history, past surgical history and problem list.  Review of Systems Constitutional: negative for chills and fevers, reports "doesn't feel right" Eyes: negative for visual disturbance Ears, nose, mouth, throat, and face: negative for nasal congestion and sore throat Gastrointestinal: positive for abdominal pain and dyspepsia, negative for change in bowel habits Genitourinary:positive for ed, decreased stream, dysuria and frequency Musculoskeletal:positive for back pain, muscle weakness and neck pain Neurological: positive for headaches and paresthesia    Objective:    BP 133/84 mmHg  Pulse 65  Temp(Src) 98.2 F (36.8 C) (Oral)  Resp 18  Ht 6\' 1"  (1.854 m)  Wt 205 lb (92.987 kg)  BMI 27.05 kg/m2  SpO2 98% BP 133/84 mmHg  Pulse 65  Temp(Src) 98.2 F (36.8 C) (Oral)  Resp 18  Ht 6\' 1"  (1.854 m)  Wt 205 lb (92.987 kg)  BMI 27.05 kg/m2  SpO2 98% General appearance: alert, cooperative, appears stated age and no distress Head:  Normocephalic, without obvious abnormality, atraumatic Eyes: negative findings: lids and lashes normal, conjunctivae and sclerae normal and mildly disconjugate gaze Abdomen: soft, non-tender; bowel sounds normal; no masses,  no organomegaly    Assessment:Plan     1. Benign prostatic hypertrophy with weak urinary stream - Ambulatory referral to Urology  2. Erectile dysfunction, unspecified erectile dysfunction type - tadalafil (CIALIS) 10 MG tablet; Take 1 tablet (10 mg total) by mouth as needed for erectile dysfunction.  Dispense: 20 tablet; Refill: 1 May double dose if needed. - Ambulatory referral to Urology  3. Migraine without aura and without status migrainosus, not intractable - SUMAtriptan (IMITREX) 100 MG tablet; Take 1 tablet (100 mg total) by mouth once. May repeat in 2 hours if headache persists or recurs.  Dispense: 15 tablet; Refill: 1  4. Gastroesophageal reflux disease, esophagitis presence not specified - omeprazole (PRILOSEC) 40 MG capsule; Take 1 capsule (40 mg total) by mouth daily.  Dispense: 30 capsule; Refill: 1 - H. pylori antibody, IgG F/u 1 mo.  6. Preventative health care - Lipid panel  See patient instructions for complete plan. F/u 1 mo

## 2013-12-16 ENCOUNTER — Ambulatory Visit
Admission: RE | Admit: 2013-12-16 | Discharge: 2013-12-16 | Disposition: A | Discharge status: Home / Self Care | Payer: BC Managed Care – PPO | Source: Ambulatory Visit | Attending: Orthopedic Surgery | Admitting: Orthopedic Surgery

## 2013-12-16 DIAGNOSIS — M5416 Radiculopathy, lumbar region: Secondary | ICD-10-CM

## 2013-12-23 ENCOUNTER — Ambulatory Visit: Payer: BC Managed Care – PPO | Admitting: Nurse Practitioner

## 2014-01-08 ENCOUNTER — Encounter: Payer: Self-pay | Admitting: Nurse Practitioner

## 2014-01-08 ENCOUNTER — Ambulatory Visit (INDEPENDENT_AMBULATORY_CARE_PROVIDER_SITE_OTHER): Payer: BC Managed Care – PPO | Admitting: Nurse Practitioner

## 2014-01-08 VITALS — BP 136/90 | HR 61 | Temp 98.0°F | Resp 18 | Ht 73.0 in | Wt 206.0 lb

## 2014-01-08 DIAGNOSIS — N521 Erectile dysfunction due to diseases classified elsewhere: Secondary | ICD-10-CM | POA: Insufficient documentation

## 2014-01-08 DIAGNOSIS — E781 Pure hyperglyceridemia: Secondary | ICD-10-CM | POA: Insufficient documentation

## 2014-01-08 DIAGNOSIS — R12 Heartburn: Secondary | ICD-10-CM

## 2014-01-08 NOTE — Patient Instructions (Signed)
For best heart health:  Cut out refined sugar- anything that is sweet when you eat or drink it, except fresh fruit. Cut out white bread, rolls, biscuits, bagels, muffins, pasta and cereals. Breads, cereals, rice, pasta, quinoa should have 4 gm or more of fiber per serving. Continue to exercise for 30 minutes 5 days/week.  Continue 40 mg omeprazole daily for another 4 weeks. You have 1 more refill. I will decrease dose to 20 mg in 1 month. If no symptoms, I will discontinue med. Start probiotic daily: either eat 1 carton yogurt daily or take probiotic capsule- Align, Culturelle, [place]. Do this for 2 mos.  I will see you in 8 weeks.  Merry Christmas!

## 2014-01-08 NOTE — Progress Notes (Signed)
Pre visit review using our clinic review tool, if applicable. No additional management support is needed unless otherwise documented below in the visit note. 

## 2014-01-08 NOTE — Assessment & Plan Note (Signed)
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 Seeing spinal specialists. Supine position resolves issue. D/c cialis.

## 2014-01-08 NOTE — Assessment & Plan Note (Signed)
4 more weeks 40 mg omep, then decrease to 20 mg. Will D/c after 4 weeks of 20 mg if no symptoms.

## 2014-01-08 NOTE — Progress Notes (Signed)
Subjective:     Warren Hall is a 48 y.o. male presents for f/u of GERD, erectile dysfunction, HA & dizziness, & discuss cholesterol results. He states he is feeling much better regarding GERD. He started 40 mg omeprazole 4 weeks ago . Reflux symptoms have resolved.  Spine specialists started him on gabapentin-he is feeling much better regarding back pain & has had no more episodes of dizziness. HA are less frequent. Pt reports he appt. Scheduled for spinal injection in couple weeks for nerve inflammation at L5-S1.  He cannot tell that cialis has improved ED, but has discovered that ED seems to be positional-no problems when supine. Etio? Myelopathy? Re cholesterol: triglycerides too high. I explained this is directly reflective of refined sugar & flour in diet. 10 year risk for ASCVD is 4%.  The following portions of the patient's history were reviewed and updated as appropriate: allergies, current medications, past medical history, past social history, past surgical history and problem list.  Review of Systems Constitutional: negative for fatigue and fevers Gastrointestinal: negative for abdominal pain, change in bowel habits and dyspepsia Genitourinary:positive for ED , negative for dysuria and frequency Neurological: negative for dizziness, gait problems, headaches, paresthesia and weakness    Objective:    BP 136/90 mmHg  Pulse 61  Temp(Src) 98 F (36.7 C) (Oral)  Resp 18  Ht 6\' 1"  (1.854 m)  Wt 206 lb (93.441 kg)  BMI 27.18 kg/m2  SpO2 96% BP 136/90 mmHg  Pulse 61  Temp(Src) 98 F (36.7 C) (Oral)  Resp 18  Ht 6\' 1"  (1.854 m)  Wt 206 lb (93.441 kg)  BMI 27.18 kg/m2  SpO2 96% General appearance: alert, cooperative, appears stated age and no distress Head: Normocephalic, without obvious abnormality, atraumatic Eyes: negative findings: lids and lashes normal and conjunctivae and sclerae normal Neck: no carotid bruit Lungs: clear to auscultation bilaterally Heart: regular  rate and rhythm, S1, S2 normal, no murmur, click, rub or gallop Abdomen: soft, non-tender; bowel sounds normal; no masses,  no organomegaly    Assessment:Plan    1. Heartburn  2. Erectile dysfunction due to diseases classified elsewhere  3. Hypertriglyceridemia  See problem list for complete A&P See pt instructions. F/u 8 weeks.

## 2014-01-08 NOTE — Assessment & Plan Note (Signed)
Cut back refined sugar & grains. Grains should have 4 grams fiber or more per serving.  Continue exercise. Repeat lipids in 6 mos.

## 2014-02-10 ENCOUNTER — Other Ambulatory Visit: Payer: Self-pay

## 2014-02-10 DIAGNOSIS — K219 Gastro-esophageal reflux disease without esophagitis: Secondary | ICD-10-CM

## 2014-02-10 MED ORDER — OMEPRAZOLE 20 MG PO CPDR: 20.0000 mg | DELAYED_RELEASE_CAPSULE | Freq: Every day | ORAL | Status: DC

## 2014-02-10 NOTE — Telephone Encounter (Signed)
[facility] requesting refill on om[facility]ole 40 mg capsule. #[phone]

## 2014-03-11 ENCOUNTER — Ambulatory Visit: Payer: BC Managed Care – PPO | Admitting: Nurse Practitioner

## 2014-03-12 ENCOUNTER — Ambulatory Visit (INDEPENDENT_AMBULATORY_CARE_PROVIDER_SITE_OTHER): Payer: BLUE CROSS/BLUE SHIELD | Admitting: Nurse Practitioner

## 2014-03-12 ENCOUNTER — Encounter: Payer: Self-pay | Admitting: Nurse Practitioner

## 2014-03-12 VITALS — BP 126/84 | HR 80 | Temp 97.6°F | Ht 73.0 in | Wt 211.0 lb

## 2014-03-12 DIAGNOSIS — E781 Pure hyperglyceridemia: Secondary | ICD-10-CM

## 2014-03-12 DIAGNOSIS — R12 Heartburn: Secondary | ICD-10-CM

## 2014-03-12 DIAGNOSIS — K219 Gastro-esophageal reflux disease without esophagitis: Secondary | ICD-10-CM

## 2014-03-12 DIAGNOSIS — N529 Male erectile dysfunction, unspecified: Secondary | ICD-10-CM

## 2014-03-12 DIAGNOSIS — G43009 Migraine without aura, not intractable, without status migrainosus: Secondary | ICD-10-CM

## 2014-03-12 DIAGNOSIS — N521 Erectile dysfunction due to diseases classified elsewhere: Secondary | ICD-10-CM

## 2014-03-12 MED ORDER — OMEPRAZOLE 40 MG PO CPDR: 40.0000 mg | DELAYED_RELEASE_CAPSULE | Freq: Every day | ORAL | Status: DC

## 2014-03-12 NOTE — Patient Instructions (Signed)
Take omeprazole twice daily until you run out of 20 mg tabs, then start new scritp-take 1 capsule daily for 8 weeks. Start probiotic-Align or Culterelle. Take 1 capsule daily for 3 months.  Get back to exercise!  Consider taking fish oil capsules-about 2000 mg daily-3 capsules.  I will see you in 2 months regarding reflux/heartburn.  I will check lipids this summer-June/July.

## 2014-03-12 NOTE — Progress Notes (Signed)
Pre visit review using our clinic review tool, if applicable. No additional management support is needed unless otherwise documented below in the visit note. 

## 2014-03-12 NOTE — Progress Notes (Signed)
Subjective:     Warren Hall is a 49 y.o. male presents to f/u for heartburn, migraine HA, erectile dysfunction, lumbar pain w/radiculopathy, and elevated triglycerides. Heartburn: likely caused by taking NSAIDS for back pain & HA. Symptoms : water brash & heartburn. Both symptoms resolved after 4 weeks 40 mg omeprazole. Heartburn returned at end of day after decreasing to 20 mg. Not bad enough to take TUMS.  Erectile dysfunction: symptoms have resolved since spinal injection in December. Not using cialis. Lumbar pain: still has L heel & foot pain daily. Not any better w/gabapentin & spinal injection. Will f/u w/[facility]. Trig: discussed diet changes & exercise. He has gained 5 lb in 8 weeks-says not running & eating "winter foods". Discussed fish oil benefits.   The following portions of the patient's history were reviewed and updated as appropriate: allergies, current medications, past medical history, past social history, past surgical history and problem list.  Review of Systems Constitutional: negative for fatigue and night sweats Ears, nose, mouth, throat, and face: negative for sore throat Respiratory: negative for cough Cardiovascular: negative for chest pressure/discomfort and palpitations Gastrointestinal: negative for abdominal pain, constipation, diarrhea, nausea and reflux symptoms Musculoskeletal:positive for back pain Neurological: negative for coordination problems, dizziness, gait problems and weakness    Objective:    BP 126/84 mmHg  Pulse 80  Temp(Src) 97.6 F (36.4 C) (Temporal)  Ht 6\' 1"  (1.854 m)  Wt 211 lb (95.709 kg)  BMI 27.84 kg/m2  SpO2 97% BP 126/84 mmHg  Pulse 80  Temp(Src) 97.6 F (36.4 C) (Temporal)  Ht 6\' 1"  (1.854 m)  Wt 211 lb (95.709 kg)  BMI 27.84 kg/m2  SpO2 97% General appearance: alert, cooperative, appears stated age and no distress Head: Normocephalic, without obvious abnormality, atraumatic Eyes: negative  findings: lids and lashes normal and conjunctivae and sclerae normal Lungs: no respiratory distress Neurologic: Grossly normal    Assessment:Plan     1. Hypertriglyceridemia Exercise Cut out sugar & flour  2. Heartburn INcrease omep to 40 mg X 8 weeks Continue probiotic F/u 8 weeks  3. Erectile dysfunction due to diseases classified elsewhere F/u w/spine spec PRN  4. Migraine without aura and without status migrainosus, not intractable Continue using 1/2 tab imitrex PRN Keep sleep schedule same, avoid food triggers  See problem list for complete A&P See pt instructions. F/u 8 wks-reflux Check lipids in June-july

## 2014-03-12 NOTE — Assessment & Plan Note (Signed)
Improvement w/40 mg omeprazole Some symptom return on 20 mg Will increase to 40 mg X 8 weeks Start probiotic qd for 3 mos F/u 8 weeks

## 2014-03-12 NOTE — Assessment & Plan Note (Signed)
Continues to take imitrex 50 mg q wk. Aware of stroke risk ibuprophen does not work & has caused gastritis Continue to take as needed Avoid triggers: changes in sleep pattern, food triggers

## 2014-03-12 NOTE — Assessment & Plan Note (Signed)
Discussed diet changes Gained 5 lbs-not exercising as much, planning to get back Start fish oil 2000  Mg qd

## 2014-03-12 NOTE — Assessment & Plan Note (Signed)
Symptoms have resolved since started gabapentin & had spinal injection Not using cialis

## 2014-05-13 ENCOUNTER — Ambulatory Visit (INDEPENDENT_AMBULATORY_CARE_PROVIDER_SITE_OTHER): Payer: BLUE CROSS/BLUE SHIELD | Admitting: Nurse Practitioner

## 2014-05-13 ENCOUNTER — Encounter: Payer: Self-pay | Admitting: Nurse Practitioner

## 2014-05-13 VITALS — BP 134/70 | HR 78 | Temp 98.5°F | Ht 73.0 in | Wt 216.0 lb

## 2014-05-13 DIAGNOSIS — K219 Gastro-esophageal reflux disease without esophagitis: Secondary | ICD-10-CM | POA: Diagnosis not present

## 2014-05-13 DIAGNOSIS — Z6828 Body mass index (BMI) 28.0-28.9, adult: Secondary | ICD-10-CM

## 2014-05-13 DIAGNOSIS — E663 Overweight: Secondary | ICD-10-CM | POA: Insufficient documentation

## 2014-05-13 MED ORDER — OMEPRAZOLE 40 MG PO CPDR: 40.0000 mg | DELAYED_RELEASE_CAPSULE | Freq: Every day | ORAL | Status: DC

## 2014-05-13 NOTE — Patient Instructions (Signed)
Continue 40 mg omeprazole for 2 months. Continue probiotic daily. Take for 3 months.  Avoid food triggers, late night eating, overeating.  In 2 months Decrease omeprazole to every other day for few weeks, then decrese to 3-4 days week.  If no symptoms return, Take as needed.  Find an exercise regimen that works.  See you in 6 mos or sooner if you need [place].

## 2014-05-13 NOTE — Progress Notes (Signed)
Subjective:     Warren Hall is an 49 y.o. male who presents for follow up of heartburn. This has been associated with nausea and waterbrash. He denies belching, choking on food and cough. Symptoms have resolved since taking omeprazole 40 mg qd. He tried to decrease to 20 mg qd, but had return of symptoms. Triggers are overeating. He started probiotic today. We discussed continuing probiotic, avoiding triggers, limiting coffee intake & red wine with goal of weaning off omeprazole & having no or rare symptoms. We discussed steady weight gain in last 6 mos-pt states due to not running due to back pain. He plays tennis some, but takes several days to recover.  The following portions of the patient's history were reviewed and updated as appropriate: allergies, current medications, past family history, past medical history, past social history, past surgical history and problem list.  Review of Systems Pertinent items are noted in HPI.   Objective:     BP 134/70 mmHg  Pulse 78  Temp(Src) 98.5 F (36.9 C) (Oral)  Ht 6\' 1"  (1.854 m)  Wt 216 lb (97.977 kg)  BMI 28.50 kg/m2  SpO2 96% General appearance: alert, cooperative, appears stated age and no distress Head: Normocephalic, without obvious abnormality, atraumatic Eyes: negative findings: lids and lashes normal and conjunctivae and sclerae normal Neurologic: Grossly normal   Assessment:Plan  1. Gastroesophageal reflux disease, esophagitis presence not specified continue - omeprazole (PRILOSEC) 40 MG capsule; Take 1 capsule (40 mg total) by mouth daily.  Dispense: 30 capsule; Refill: 4 Continue probiotic Discussed wean off omep to PRN or pepcid PRN See pt instructions.  2. BMI 28.0-28.9,adult Exercise, avoid overeating to help with weight loss  F/u 6 mos

## 2014-05-13 NOTE — Progress Notes (Signed)
Pre visit review using our clinic review tool, if applicable. No additional management support is needed unless otherwise documented below in the visit note. 

## 2014-06-27 ENCOUNTER — Other Ambulatory Visit: Payer: Self-pay

## 2014-06-27 DIAGNOSIS — G43009 Migraine without aura, not intractable, without status migrainosus: Secondary | ICD-10-CM

## 2014-06-27 MED ORDER — SUMATRIPTAN SUCCINATE 100 MG PO TABS: 100.0000 mg | ORAL_TABLET | Freq: Once | ORAL | Status: DC

## 2014-06-27 NOTE — Telephone Encounter (Signed)
Please Advise Refill Request? Refill request for- Sumtriptan Last filled by MD on - 12/11/13 Last Appt - 05/13/14        Next Appt - 12/01/14 Pharmacy- [facility]

## 2014-08-19 ENCOUNTER — Other Ambulatory Visit: Payer: Self-pay | Admitting: Family Medicine

## 2014-08-19 DIAGNOSIS — G43009 Migraine without aura, not intractable, without status migrainosus: Secondary | ICD-10-CM

## 2014-08-19 DIAGNOSIS — K219 Gastro-esophageal reflux disease without esophagitis: Secondary | ICD-10-CM

## 2014-08-19 MED ORDER — OMEPRAZOLE 40 MG PO CPDR: 40.0000 mg | DELAYED_RELEASE_CAPSULE | Freq: Every day | ORAL | Status: DC

## 2014-08-19 MED ORDER — SUMATRIPTAN SUCCINATE 100 MG PO TABS: 100.0000 mg | ORAL_TABLET | Freq: Once | ORAL | Status: DC

## 2014-09-29 ENCOUNTER — Telehealth: Payer: Self-pay

## 2014-09-29 DIAGNOSIS — G43009 Migraine without aura, not intractable, without status migrainosus: Secondary | ICD-10-CM

## 2014-09-29 MED ORDER — SUMATRIPTAN SUCCINATE 100 MG PO TABS: 100.0000 mg | ORAL_TABLET | Freq: Once | ORAL | Status: DC

## 2014-09-29 NOTE — Telephone Encounter (Signed)
Refilled for pt, he must be seen for evaluation for future refills. Thanks.

## 2014-09-29 NOTE — Telephone Encounter (Signed)
rx request to fill Sumatriptan. LOV with [facility] (05/2014) OV appt to [facility] with new PCP is 12/01/2014. Last fill on requested rx was 08/2014 for #15 with 2 refills.  Pended for your review.

## 2014-09-30 NOTE — Telephone Encounter (Signed)
LVM for pt to call back as soon as possible.   RE: rx instruction

## 2014-10-21 ENCOUNTER — Ambulatory Visit (INDEPENDENT_AMBULATORY_CARE_PROVIDER_SITE_OTHER): Payer: BLUE CROSS/BLUE SHIELD | Admitting: Family Medicine

## 2014-10-21 ENCOUNTER — Encounter: Payer: Self-pay | Admitting: Family Medicine

## 2014-10-21 ENCOUNTER — Ambulatory Visit (HOSPITAL_BASED_OUTPATIENT_CLINIC_OR_DEPARTMENT_OTHER)
Admission: RE | Admit: 2014-10-21 | Discharge: 2014-10-21 | Disposition: A | Discharge status: Home / Self Care | Payer: BLUE CROSS/BLUE SHIELD | Source: Ambulatory Visit | Attending: Family Medicine | Admitting: Family Medicine

## 2014-10-21 ENCOUNTER — Telehealth: Payer: Self-pay | Admitting: Family Medicine

## 2014-10-21 VITALS — BP 128/85 | HR 71 | Temp 98.4°F | Resp 18 | Ht 73.0 in | Wt 218.0 lb

## 2014-10-21 DIAGNOSIS — M546 Pain in thoracic spine: Secondary | ICD-10-CM | POA: Diagnosis not present

## 2014-10-21 DIAGNOSIS — M47896 Other spondylosis, lumbar region: Secondary | ICD-10-CM | POA: Insufficient documentation

## 2014-10-21 DIAGNOSIS — Z23 Encounter for immunization: Secondary | ICD-10-CM | POA: Diagnosis not present

## 2014-10-21 DIAGNOSIS — M545 Low back pain: Secondary | ICD-10-CM | POA: Insufficient documentation

## 2014-10-21 DIAGNOSIS — M47814 Spondylosis without myelopathy or radiculopathy, thoracic region: Secondary | ICD-10-CM | POA: Insufficient documentation

## 2014-10-21 MED ORDER — NAPROXEN 500 MG PO TABS: 500.0000 mg | ORAL_TABLET | Freq: Two times a day (BID) | ORAL | Status: DC

## 2014-10-21 MED ORDER — CYCLOBENZAPRINE HCL 10 MG PO TABS: 10.0000 mg | ORAL_TABLET | Freq: Three times a day (TID) | ORAL | Status: DC | PRN

## 2014-10-21 MED ORDER — METHYLPREDNISOLONE ACETATE 40 MG/ML IJ SUSP
40.0000 mg | Freq: Once | INTRAMUSCULAR | Status: AC
  Administered 2014-10-21: 40 mg via INTRA_ARTICULAR

## 2014-10-21 NOTE — Patient Instructions (Signed)
Naproxen BID for 5 days, and then as needed. Flexeril at night 5 days, and then as needed I will call you with the results of the xrays once they are available.  Use heating pad a few times a day  Muscle Strain A muscle strain is an injury that occurs when a muscle is stretched beyond its normal length. Usually a small number of muscle fibers are torn when this happens. Muscle strain is rated in degrees. First-degree strains have the least amount of muscle fiber tearing and pain. Second-degree and third-degree strains have increasingly more tearing and pain.  Usually, recovery from muscle strain takes 1-2 weeks. Complete healing takes 5-6 weeks.  CAUSES  Muscle strain happens when a sudden, violent force placed on a muscle stretches it too far. This may occur with lifting, sports, or a fall.  RISK FACTORS Muscle strain is especially common in athletes.  SIGNS AND SYMPTOMS At the site of the muscle strain, there may be:  Pain.  Bruising.  Swelling.  Difficulty using the muscle due to pain or lack of normal function. DIAGNOSIS  Your health care provider will perform a physical exam and ask about your medical history. TREATMENT  Often, the best treatment for a muscle strain is resting, icing, and applying cold compresses to the injured area.  HOME CARE INSTRUCTIONS   Use the PRICE method of treatment to promote muscle healing during the first 2-3 days after your injury. The PRICE method involves:  Protecting the muscle from being injured again.  Restricting your activity and resting the injured body part.  Icing your injury. To do this, put ice in a plastic bag. Place a towel between your skin and the bag. Then, apply the ice and leave it on from 15-20 minutes each hour. After the third day, switch to moist heat packs.  Apply compression to the injured area with a splint or elastic bandage. Be careful not to wrap it too tightly. This may interfere with blood circulation or increase  swelling.  Elevate the injured body part above the level of your heart as often as you can.  Only take over-the-counter or prescription medicines for pain, discomfort, or fever as directed by your health care provider.  Warming up prior to exercise helps to prevent future muscle strains. SEEK MEDICAL CARE IF:   You have increasing pain or swelling in the injured area.  You have numbness, tingling, or a significant loss of strength in the injured area. MAKE SURE YOU:   Understand these instructions.  Will watch your condition.  Will get help right away if you are not doing well or get worse. Document Released: 01/17/2005 Document Revised: 11/07/2012 Document Reviewed: 08/16/2012 [facility] Patient Information 2015 [place], [place]. This information is not intended to replace advice given to you by your health care provider. Make sure you discuss any questions you have with your health care provider.

## 2014-10-21 NOTE — Progress Notes (Signed)
Pre visit review using our clinic review tool, if applicable. No additional management support is needed unless otherwise documented below in the visit note. 

## 2014-10-21 NOTE — Telephone Encounter (Signed)
Please call patient, his x-rays did not show any subluxation or fractures of the ribs or spine. He likely has a strained muscle, and the plan that works in the office should help him with this. He should continue to rest, use heat when able, continue the naproxen and the Flexeril. If no improvement in 2-4 weeks, or his symptoms are worsening we would need to see him again.

## 2014-10-21 NOTE — Progress Notes (Signed)
   Subjective:    Patient ID: Warren Hall, male    DOB: 1965-02-09, 49 y.o.   MRN: [id number]  HPI Patient presents today for acute back pain  Back pain: Patient states approximately 2 weeks ago he was moving furniture and "tweaked" his back. He denies any prior back surgery, but admits to prior chronic back pain as occasionally has flares. He has had injections at the L5-S1 disc space which seems to have improved his pain. He states this pain is different and is located at the right side right at the "bottom of the lung ". Patient states he has been using heat and ibuprofen for pain relief. He has continued to do his activities, he states last week was painful with his golf swing. He also played tennis last night and feels like the bottom of his lung is painful, and is not allowing him to take a deep breath. He states he has had asthma, and use his inhaler for the shortness of breath, and he felt that this helped. Family history of lung cancer.  Never smoker Past Medical History  Diagnosis Date  . Seasonal asthma   . Lumbar herniated disc 2001    conservative treatment  . Allergy     Seasonal  . Frequent headaches   . Migraines    No Known Allergies Past Surgical History  Procedure Laterality Date  . Achilles tendon repair    . Lasik     Family History  Problem Relation Age of Onset  . COPD Mother   . Hypertension Father   . COPD Maternal Grandmother   . Multiple sclerosis Maternal Grandfather   . Cancer Paternal Grandfather     lung    Review of Systems Negative, with the exception of above mentioned in HPI     Objective:   Physical Exam BP 128/85 mmHg  Pulse 71  Temp(Src) 98.4 F (36.9 C) (Temporal)  Resp 18  Ht 6\' 1"  (1.854 m)  Wt 218 lb (98.884 kg)  BMI 28.77 kg/m2  SpO2 98% Gen: Afebrile. No acute distress. Nontoxic in appearance, well-developed, well-nourished Caucasian male. Pleasant. MSK: No erythema, no soft tissue swelling, no bruising. No bony  tenderness thoracic or lumbar spine. Mild tenderness to palpation right T10-L2 area, tenderness to palpation over the floating ribs and paraspinal area. No muscle fullness palpated. Negative straight leg raises. Mildly decreased range of motion in rotation secondary to discomfort. Neurovascularly intact distally. Skin: No rashes, purpura or petechiae.  Neuro: Normal gait. PERLA. EOMi. Alert. Oriented x3. Cranial nerves II through XII intact. Muscle strength 5/5 lower extremity. DTRs equal bilaterally.     Assessment & Plan:  1. Need for prophylactic vaccination and inoculation against influenza - Flu Vaccine QUAD 36+ mos IM  2. Right-sided thoracic back pain - Rest, heat, naproxen, Flexeril. Will obtain x-rays today and call patient with results once they become available. - IM Depo-Medrol 40 mg given today. - DG Thoracic Spine W/Swimmers; Future - DG Lumbar Spine Complete; Future - cyclobenzaprine (FLEXERIL) 10 MG tablet; Take 1 tablet (10 mg total) by mouth 3 (three) times daily as needed for muscle spasms.  Dispense: 30 tablet; Refill: 0 - naproxen (NAPROSYN) 500 MG tablet; Take 1 tablet (500 mg total) by mouth 2 (two) times daily with a meal.  Dispense: 30 tablet; Refill: 0 - methylPREDNISolone acetate (DEPO-MEDROL) injection 40 mg; Inject 1 mL (40 mg total) into the articular space once.

## 2014-10-21 NOTE — Telephone Encounter (Signed)
Left detailed message on pt's cell.  Okay per DPR. 

## 2014-12-01 ENCOUNTER — Encounter: Payer: BLUE CROSS/BLUE SHIELD | Admitting: Family Medicine

## 2014-12-03 ENCOUNTER — Ambulatory Visit (INDEPENDENT_AMBULATORY_CARE_PROVIDER_SITE_OTHER): Payer: BLUE CROSS/BLUE SHIELD | Admitting: Family Medicine

## 2014-12-03 ENCOUNTER — Encounter: Payer: Self-pay | Admitting: Family Medicine

## 2014-12-03 VITALS — BP 118/83 | HR 66 | Temp 98.3°F | Resp 20 | Ht 73.0 in | Wt 216.5 lb

## 2014-12-03 DIAGNOSIS — E785 Hyperlipidemia, unspecified: Secondary | ICD-10-CM | POA: Insufficient documentation

## 2014-12-03 DIAGNOSIS — Z0000 Encounter for general adult medical examination without abnormal findings: Secondary | ICD-10-CM | POA: Diagnosis not present

## 2014-12-03 DIAGNOSIS — Z125 Encounter for screening for malignant neoplasm of prostate: Secondary | ICD-10-CM | POA: Insufficient documentation

## 2014-12-03 DIAGNOSIS — Z6828 Body mass index (BMI) 28.0-28.9, adult: Secondary | ICD-10-CM | POA: Diagnosis not present

## 2014-12-03 NOTE — Progress Notes (Signed)
Subjective:    Patient ID: Warren Hall, male    DOB: 1965/02/09, 49 y.o.   MRN: [id number]  HPI Patient presents for annual visit. Patient has health advocate form to be filled out for his employer once results of labs become available.  Hyperlipidemia: Patient has a history of elevated cholesterol. His cholesterol was last checked November 2015 with a total cholesterol 262, triglyceride 242, HDL 47, LDL 170. Patient was counseled on dietary changes and increasing his exercise, he admits he cannot make any changes at that time. He is now exercising 3-4 times a week, walking 2-3 miles a day. His tried to make some dietary changes, but admits he has not been very faithful on a diet.  Health maintenance:  Colonoscopy: No FHX, Routine screening 70 (Dad with polyps)  Immunizations: UTD flu and td Infectious disease screening: HIV completed. Prostate: 2015, 0.93. With CT, normal no changes. Following with urology.  Past Medical History  Diagnosis Date  . Seasonal asthma   . Lumbar herniated disc 2001    conservative treatment  . Allergy     Seasonal  . Frequent headaches   . Migraines    No Known Allergies Past Surgical History  Procedure Laterality Date  . Achilles tendon repair    . Lasik     Family History  Problem Relation Age of Onset  . COPD Mother   . Hypertension Father   . COPD Maternal Grandmother   . Multiple sclerosis Maternal Grandfather   . Cancer Paternal Grandfather     lung   Social History   Social History  . Marital Status: Divorced    Spouse Name: N/A  . Number of Children: 2  . Years of Education: Bachelor D   Occupational History  . Sales   .     Social History Main Topics  . Smoking status: Never Smoker   . Smokeless tobacco: Never Used  . Alcohol Use: Yes  . Drug Use: No  . Sexual Activity: Yes   Other Topics Concern  . Not on file   Social History Narrative   Warren Hall lives with his daughter. He works Medical laboratory scientific officer.    Review of  Systems Negative, with the exception of above mentioned in HPI    Objective:   Physical Exam BP 118/83 mmHg  Pulse 66  Temp(Src) 98.3 F (36.8 C) (Oral)  Resp 20  Ht _0  (1.854 m)  Wt 216 lb 8 oz (98.204 kg)  BMI 28.57 kg/m2  SpO2 97% Gen: Afebrile. No acute distress. Nontoxic in appearance, well-developed, well-nourished Caucasian male. Pleasant. HENT: AT. [place]. Bilateral TM visualized and normal in appearance. MMM. Bilateral nares without erythema or swelling. Throat without erythema or exudates. No cough or hoarseness on exam Eyes:Pupils Equal Round Reactive to light, Extraocular movements intact,  Conjunctiva without redness, discharge or icterus. Neck/lymp/endocrine: Supple, no lymphadenopathy, no thyromegaly CV: RRR no murmurs appreciated, no edema, +2/4 P posterior tibialis pulses Chest: CTAB, no wheeze or crackles Abd: Soft. Flat. NTND. BS present. No Masses palpated.  MSK: No obvious deformities, full range of motion. Muscle strength 5/5 upper and lower extremity. Skin: No rashes, purpura or petechiae. Neuro:  Normal gait. PERLA. EOMi. Alert. Oriented. Cranial nerves II through XII intact.  Psych: Normal affect, dress and demeanor. Normal speech. Normal thought content and judgment.    Assessment & Plan:  1. Prostate cancer screening - Patient counseled on prostate screening. Patient wishes to have PSA drawn.  - PSA; Future  2. Hyperlipidemia - Patient encouraged to eat a healthy diet, full of fresh fruits and vegetables, lean meats. Exercise greater than 150 minutes a week. - Lipid panel; Future  3. BMI 28.0-28.9,adult - CBC w/Diff; Future - Comp Met (CMET); Future - A1c future  4. Encounter for Preventative health examination:  Health maintenance:  Colonoscopy: No FHX, Routine screening 42 (Dad with polyps)  Immunizations: UTD flu and td Infectious disease screening: HIV completed. Prostate: 2015, 0.93. With CT, normal no changes. Following with urology.  F/U  3-12 months depending on lab results and need of starting statin.

## 2014-12-03 NOTE — Patient Instructions (Signed)

## 2014-12-04 ENCOUNTER — Other Ambulatory Visit (INDEPENDENT_AMBULATORY_CARE_PROVIDER_SITE_OTHER): Payer: BLUE CROSS/BLUE SHIELD

## 2014-12-04 DIAGNOSIS — R7989 Other specified abnormal findings of blood chemistry: Secondary | ICD-10-CM

## 2014-12-04 DIAGNOSIS — Z6828 Body mass index (BMI) 28.0-28.9, adult: Secondary | ICD-10-CM | POA: Diagnosis not present

## 2014-12-04 DIAGNOSIS — Z125 Encounter for screening for malignant neoplasm of prostate: Secondary | ICD-10-CM

## 2014-12-04 DIAGNOSIS — E785 Hyperlipidemia, unspecified: Secondary | ICD-10-CM | POA: Diagnosis not present

## 2014-12-04 LAB — CBC WITH DIFFERENTIAL/PLATELET
BASOS PCT: 0.5 % (ref 0.0–3.0)
Basophils Absolute: 0 10*3/uL (ref 0.0–0.1)
EOS PCT: 2.7 % (ref 0.0–5.0)
Eosinophils Absolute: 0.2 10*3/uL (ref 0.0–0.7)
HEMATOCRIT: 46.3 % (ref 39.0–52.0)
HEMOGLOBIN: 15.7 g/dL (ref 13.0–17.0)
Lymphocytes Relative: 40.1 % (ref 12.0–46.0)
Lymphs Abs: 3.2 10*3/uL (ref 0.7–4.0)
MCHC: 33.8 g/dL (ref 30.0–36.0)
MCV: 94.6 fl (ref 78.0–100.0)
MONO ABS: 0.7 10*3/uL (ref 0.1–1.0)
Monocytes Relative: 8.2 % (ref 3.0–12.0)
NEUTROS PCT: 48.5 % (ref 43.0–77.0)
Neutro Abs: 3.9 10*3/uL (ref 1.4–7.7)
Platelets: 303 10*3/uL (ref 150.0–400.0)
RBC: 4.9 Mil/uL (ref 4.22–5.81)
RDW: 13.2 % (ref 11.5–15.5)
WBC: 8.1 10*3/uL (ref 4.0–10.5)

## 2014-12-04 LAB — COMPREHENSIVE METABOLIC PANEL
ALBUMIN: 4.1 g/dL (ref 3.5–5.2)
ALK PHOS: 49 U/L (ref 39–117)
ALT: 26 U/L (ref 0–53)
AST: 22 U/L (ref 0–37)
BUN: 15 mg/dL (ref 6–23)
CO2: 30 mEq/L (ref 19–32)
CREATININE: 1.02 mg/dL (ref 0.40–1.50)
Calcium: 9.7 mg/dL (ref 8.4–10.5)
Chloride: 105 mEq/L (ref 96–112)
GFR: 82.73 mL/min (ref >60.00)
GLUCOSE: 90 mg/dL (ref 70–99)
Potassium: 4.5 mEq/L (ref 3.5–5.1)
SODIUM: 140 meq/L (ref 135–145)
TOTAL PROTEIN: 6.6 g/dL (ref 6.0–8.3)
Total Bilirubin: 0.7 mg/dL (ref 0.2–1.2)

## 2014-12-04 LAB — LIPID PANEL
CHOLESTEROL: 245 mg/dL — AB (ref 0–200)
HDL: 42.2 mg/dL (ref >39.00)
NonHDL: 202.34
Total CHOL/HDL Ratio: 6
Triglycerides: 263 mg/dL — ABNORMAL HIGH (ref 0.0–149.0)
VLDL: 52.6 mg/dL — ABNORMAL HIGH (ref 0.0–40.0)

## 2014-12-04 LAB — LDL CHOLESTEROL, DIRECT: Direct LDL: 137 mg/dL

## 2014-12-04 LAB — PSA: PSA: 0.79 ng/mL (ref 0.10–4.00)

## 2014-12-04 LAB — HEMOGLOBIN A1C: Hgb A1c MFr Bld: 5.4 % (ref 4.6–6.5)

## 2014-12-05 ENCOUNTER — Telehealth: Payer: Self-pay | Admitting: Family Medicine

## 2014-12-05 NOTE — Telephone Encounter (Signed)
Please call patient: -All of his lab work appeared normal, with the exception of his cholesterol panel. - patient's total cholesterol was 245, triglycerides were 263, HDL 42, LDL 137. - patient needs to exercise at least 150 minutes a week, he diet full of fresh fruits, vegetables and lean meats. Avoiding high sugar in saturated fats. - he should be encouraged to try an over-the-counter fish oil supplementation - I would like to retest his cholesterol panel in 6 months. - I have filled out his health care forms, he can either pick them up or if he has a number we can fax them with his permission.

## 2014-12-05 NOTE — Telephone Encounter (Signed)
Left a message for patient with lab results and patient instructions. Patient to call back to let us know if he wants his health form faxed or will he pick it up.

## 2014-12-08 NOTE — Telephone Encounter (Signed)
Faxed health screening form to [facility]   [phone] original sent to scan.

## 2015-02-16 ENCOUNTER — Other Ambulatory Visit: Payer: Self-pay | Admitting: *Deleted

## 2015-02-16 DIAGNOSIS — K219 Gastro-esophageal reflux disease without esophagitis: Secondary | ICD-10-CM

## 2015-02-16 MED ORDER — OMEPRAZOLE 40 MG PO CPDR: 40.0000 mg | DELAYED_RELEASE_CAPSULE | Freq: Every day | ORAL | Status: DC

## 2015-02-16 NOTE — Telephone Encounter (Signed)
Omeprazole refilled per refill protocol

## 2015-05-04 ENCOUNTER — Encounter: Payer: Self-pay | Admitting: Family Medicine

## 2015-05-04 ENCOUNTER — Ambulatory Visit (INDEPENDENT_AMBULATORY_CARE_PROVIDER_SITE_OTHER): Payer: BLUE CROSS/BLUE SHIELD | Admitting: Family Medicine

## 2015-05-04 VITALS — BP 115/86 | HR 70 | Temp 98.5°F | Resp 20 | Wt 214.8 lb

## 2015-05-04 DIAGNOSIS — J0100 Acute maxillary sinusitis, unspecified: Secondary | ICD-10-CM | POA: Diagnosis not present

## 2015-05-04 MED ORDER — DOXYCYCLINE HYCLATE 100 MG PO TABS: 100.0000 mg | ORAL_TABLET | Freq: Two times a day (BID) | ORAL | Status: DC

## 2015-05-04 MED ORDER — PREDNISONE 50 MG PO TABS: 50.0000 mg | ORAL_TABLET | Freq: Every day | ORAL | Status: DC

## 2015-05-04 NOTE — Patient Instructions (Signed)
Claritin/ zyrtec and flonase daily.  Start doxycyline 10 days (every 12 hours) Prednisone 50 mg daily with food.    Sinusitis, Adult Sinusitis is redness, soreness, and inflammation of the paranasal sinuses. Paranasal sinuses are air pockets within the bones of your face. They are located beneath your eyes, in the middle of your forehead, and above your eyes. In healthy paranasal sinuses, mucus is able to drain out, and air is able to circulate through them by way of your nose. However, when your paranasal sinuses are inflamed, mucus and air can become trapped. This can allow bacteria and other germs to grow and cause infection. Sinusitis can develop quickly and last only a short time (acute) or continue over a long period (chronic). Sinusitis that lasts for more than 12 weeks is considered chronic. CAUSES Causes of sinusitis include:  Allergies.  Structural abnormalities, such as displacement of the cartilage that separates your nostrils (deviated septum), which can decrease the air flow through your nose and sinuses and affect sinus drainage.  Functional abnormalities, such as when the small hairs (cilia) that line your sinuses and help remove mucus do not work properly or are not present. SIGNS AND SYMPTOMS Symptoms of acute and chronic sinusitis are the same. The primary symptoms are pain and pressure around the affected sinuses. Other symptoms include:  Upper toothache.  Earache.  Headache.  Bad breath.  Decreased sense of smell and taste.  A cough, which worsens when you are lying flat.  Fatigue.  Fever.  Thick drainage from your nose, which often is green and may contain pus (purulent).  Swelling and warmth over the affected sinuses. DIAGNOSIS Your health care provider will perform a physical exam. During your exam, your health care provider may perform any of the following to help determine if you have acute sinusitis or chronic sinusitis:  Look in your nose for signs  of abnormal growths in your nostrils (nasal polyps).  Tap over the affected sinus to check for signs of infection.  View the inside of your sinuses using an imaging device that has a light attached (endoscope). If your health care provider suspects that you have chronic sinusitis, one or more of the following tests may be recommended:  Allergy tests.  Nasal culture. A sample of mucus is taken from your nose, sent to a lab, and screened for bacteria.  Nasal cytology. A sample of mucus is taken from your nose and examined by your health care provider to determine if your sinusitis is related to an allergy. TREATMENT Most cases of acute sinusitis are related to a viral infection and will resolve on their own within 10 days. Sometimes, medicines are prescribed to help relieve symptoms of both acute and chronic sinusitis. These may include pain medicines, decongestants, nasal steroid sprays, or saline sprays. However, for sinusitis related to a bacterial infection, your health care provider will prescribe antibiotic medicines. These are medicines that will help kill the bacteria causing the infection. Rarely, sinusitis is caused by a fungal infection. In these cases, your health care provider will prescribe antifungal medicine. For some cases of chronic sinusitis, surgery is needed. Generally, these are cases in which sinusitis recurs more than 3 times per year, despite other treatments. HOME CARE INSTRUCTIONS  Drink plenty of water. Water helps thin the mucus so your sinuses can drain more easily.  Use a humidifier.  Inhale steam 3-4 times a day (for example, sit in the bathroom with the shower running).  Apply a warm, moist washcloth  to your face 3-4 times a day, or as directed by your health care provider.  Use saline nasal sprays to help moisten and clean your sinuses.  Take medicines only as directed by your health care provider.  If you were prescribed either an antibiotic or  antifungal medicine, finish it all even if you start to feel better. SEEK IMMEDIATE MEDICAL CARE IF:  You have increasing pain or severe headaches.  You have nausea, vomiting, or drowsiness.  You have swelling around your face.  You have vision problems.  You have a stiff neck.  You have difficulty breathing.   This information is not intended to replace advice given to you by your health care provider. Make sure you discuss any questions you have with your health care provider.   Document Released: 01/17/2005 Document Revised: 02/07/2014 Document Reviewed: 02/01/2011 Elsevier Interactive Patient Education 2016 [place] An allergy is an abnormal reaction to a substance by the body's defense system (immune system). Allergies can develop at any age. WHAT CAUSES ALLERGIES? An allergic reaction happens when the immune system mistakenly reacts to a normally harmless substance, called an allergen, as if it were harmful. The immune system releases antibodies to fight the substance. Antibodies eventually release a chemical called histamine into the bloodstream. The release of histamine is meant to protect the body from infection, but it also causes discomfort. An allergic reaction can be triggered by:  Eating an allergen.  Inhaling an allergen.  Touching an allergen. WHAT TYPES OF ALLERGIES ARE THERE? There are many types of allergies. Common types include:  Seasonal allergies. People with this type of allergy are usually allergic to substances that are only present during certain seasons, such as molds and pollens.  Food allergies.  Drug allergies.  Insect allergies.  Animal dander allergies. WHAT ARE SYMPTOMS OF ALLERGIES? Possible allergy symptoms include:  Swelling of the lips, face, tongue, mouth, or throat.  Sneezing, coughing, or wheezing.  Nasal congestion.  Tingling in the mouth.  Rash.  Itching.  Itchy, red, swollen areas of skin  (hives).  Watery eyes.  Vomiting.  Diarrhea.  Dizziness.  Lightheadedness.  Fainting.  Trouble breathing or swallowing.  Chest tightness.  Rapid heartbeat. HOW ARE ALLERGIES DIAGNOSED? Allergies are diagnosed with a medical and family history and one or more of the following:  Skin tests.  Blood tests.  A food diary. A food diary is a record of all the foods and drinks you have in a day and of all the symptoms you experience.  The results of an elimination diet. An elimination diet involves eliminating foods from your diet and then adding them back in one by one to find out if a certain food causes an allergic reaction. HOW ARE ALLERGIES TREATED? There is no cure for allergies, but allergic reactions can be treated with medicine. Severe reactions usually need to be treated at a hospital. HOW CAN REACTIONS BE PREVENTED? The best way to prevent an allergic reaction is by avoiding the substance you are allergic to. Allergy shots and medicines can also help prevent reactions in some cases. People with severe allergic reactions may be able to prevent a life-threatening reaction called anaphylaxis with a medicine given right after exposure to the allergen.   This information is not intended to replace advice given to you by your health care provider. Make sure you discuss any questions you have with your health care provider.   Document Released: 04/12/2002 Document Revised: 02/07/2014 Document Reviewed: 10/29/2013 Elsevier  Interactive Patient Education [employer].

## 2015-05-04 NOTE — Progress Notes (Signed)
Patient ID: Warren Foley., male   DOB: 09/23/1965, 50 y.o.   MRN: [id number]    Warren Boat. , 1965-05-11, 50 y.o., male MRN: [id number]  CC: Cough Subjective: Pt presents for an acute OV with complaints of cough of 1 week duration.  Associated symptoms include nasal congestion, chest congestion, muscle aches, chills, PND, sinus pressure, headache. patient denies sore throat, nausea, vomit, diarrhea. Pt has a history of asthma and allergies. Albuterol use not needing yet, but "came close a couple times".  He thought it was his allergies, but then symptoms have [rogressed  into his chest. He started to feel achy. He is taking mucinex, that is helping a small amount. Restarted his flonase 3 days ago.   No Known Allergies Social History  Substance Use Topics  . Smoking status: Never Smoker   . Smokeless tobacco: Never Used  . Alcohol Use: Yes   Past Medical History  Diagnosis Date  . Seasonal asthma   . Lumbar herniated disc 2001    conservative treatment  . Allergy     Seasonal  . Frequent headaches   . Migraines    Past Surgical History  Procedure Laterality Date  . Achilles tendon repair    . Lasik     Family History  Problem Relation Age of Onset  . COPD Mother   . Hypertension Father   . COPD Maternal Grandmother   . Multiple sclerosis Maternal Grandfather   . Cancer Paternal Grandfather     lung     Medication List       This list is accurate as of: 05/04/15 10:34 AM.  Always use your most recent med list.               albuterol 108 (90 Base) MCG/ACT inhaler  Commonly known as:  PROVENTIL HFA;VENTOLIN HFA  Inhale 2 puffs into the lungs every 4 (four) hours as needed for wheezing.     gabapentin 300 MG capsule  Commonly known as:  NEURONTIN  Take 300 mg by mouth 3 (three) times daily. 1 T am, 2 T afternoon, 1 T pm     omeprazole 40 MG capsule  Commonly known as:  PRILOSEC  Take 1 capsule (40 mg total) by mouth daily.     SUMAtriptan 100 MG tablet    Commonly known as:  IMITREX  Take 1 tablet (100 mg total) by mouth once. May repeat in 2 hours if headache persists or recurs.       ROS: Negative, with the exception of above mentioned in HPI  Objective:  BP 115/86 mmHg  Pulse 70  Temp(Src) 98.5 F (36.9 C)  Resp 20  Wt 214 lb 12 oz (97.41 kg)  SpO2 96% Body mass index is 28.34 kg/(m^2). Gen: Afebrile. No acute distress. Nontoxic in appearance, well developed, well nourished. Pleasant male.  HENT: AT. [place]. Bilateral TM visualized and normal in appearance. MMM, no oral lesions. Bilateral nares with erythema and swelling. Throat without erythema or exudates. PND present. No cough on exam. Hoarseness present.  Eyes:Pupils Equal Round Reactive to light, Extraocular movements intact,  Conjunctiva without redness, discharge or icterus. Neck/lymp/endocrine: Supple, shotty ant cervical lymphadenopathy CV: RRR  Chest: CTAB, no wheeze or crackles. Good air movement, normal resp effort.  Abd: Soft.NTND. BS present Neuro: Normal gait. PERLA. EOMi. Alert. Oriented x3   Assessment/Plan: Warren Hall. is a 50 y.o. male present for acute OV for  1. Acute maxillary sinusitis, recurrence not  specified - rest, hydrate, flonase, antihistamine, mucinex - discussed daily need of allergy regimen during season.  - doxycycline (VIBRA-TABS) 100 MG tablet; Take 1 tablet (100 mg total) by mouth 2 (two) times daily.  Dispense: 20 tablet; Refill: 0 - predniSONE (DELTASONE) 50 MG tablet; Take 1 tablet (50 mg total) by mouth daily with breakfast.  Dispense: 5 tablet; Refill: 0 - F/U PRN   electronically signed by:  Howard Pouch, DO  [place]

## 2015-06-22 ENCOUNTER — Other Ambulatory Visit: Payer: Self-pay | Admitting: *Deleted

## 2015-06-22 DIAGNOSIS — G43009 Migraine without aura, not intractable, without status migrainosus: Secondary | ICD-10-CM

## 2015-06-22 MED ORDER — SUMATRIPTAN SUCCINATE 100 MG PO TABS: 100.0000 mg | ORAL_TABLET | Freq: Once | ORAL | Status: DC

## 2015-06-22 NOTE — Telephone Encounter (Signed)
Imitrex refilled.

## 2015-08-14 ENCOUNTER — Other Ambulatory Visit: Payer: Self-pay | Admitting: *Deleted

## 2015-08-14 DIAGNOSIS — K219 Gastro-esophageal reflux disease without esophagitis: Secondary | ICD-10-CM

## 2015-08-14 MED ORDER — OMEPRAZOLE 40 MG PO CPDR: 40.0000 mg | DELAYED_RELEASE_CAPSULE | Freq: Every day | ORAL | Status: DC

## 2015-08-20 ENCOUNTER — Telehealth: Payer: Self-pay | Admitting: Family Medicine

## 2015-08-20 DIAGNOSIS — J45998 Other asthma: Secondary | ICD-10-CM

## 2015-08-20 MED ORDER — ALBUTEROL SULFATE HFA 108 (90 BASE) MCG/ACT IN AERS: 2.0000 | INHALATION_SPRAY | RESPIRATORY_TRACT | Status: DC | PRN

## 2015-08-20 NOTE — Telephone Encounter (Signed)
Refill sent patient needs appt  Prior to anymore refills.

## 2015-08-20 NOTE — Telephone Encounter (Signed)
Pt would like a refill on his albuterol inhaler sent to [facility] in [place] please.

## 2015-09-02 ENCOUNTER — Telehealth: Payer: Self-pay | Admitting: *Deleted

## 2015-09-02 NOTE — Telephone Encounter (Signed)
Patient called requesting refill on his omeprazole . I had sent in 30 day supply on 08/14/15 but patient states his insurance will only pay for 90 day Rx. Patient has not followed up as indicated in prior note. Please advise.

## 2015-09-03 NOTE — Telephone Encounter (Signed)
Pt is overdue for his follow-up on GERD and cholesterol recheck. I have been asked to refill script for omeprazole for 90 days, instead of 30 days (which was done because he is overdue for follow up).   I will refill his script for 90 days, on the honor system that he will schedule an appt this month for follow-up on these issues with fasting lab/cholesterol.   If there is no follow up on this issues within 1 month, I will not be able to provide further refill. Thanks.

## 2015-09-03 NOTE — Telephone Encounter (Signed)
Spoke with patient reviewed information . Patient states he will check his schedule and call back to make appt.

## 2015-09-04 ENCOUNTER — Other Ambulatory Visit: Payer: Self-pay | Admitting: *Deleted

## 2015-09-04 DIAGNOSIS — K219 Gastro-esophageal reflux disease without esophagitis: Secondary | ICD-10-CM

## 2015-09-04 DIAGNOSIS — J0100 Acute maxillary sinusitis, unspecified: Secondary | ICD-10-CM

## 2015-09-04 MED ORDER — OMEPRAZOLE 40 MG PO CPDR: 40.0000 mg | DELAYED_RELEASE_CAPSULE | Freq: Every day | ORAL | 0 refills | Status: DC

## 2015-12-07 ENCOUNTER — Encounter: Payer: Self-pay | Admitting: Family Medicine

## 2015-12-07 ENCOUNTER — Ambulatory Visit (INDEPENDENT_AMBULATORY_CARE_PROVIDER_SITE_OTHER): Payer: BLUE CROSS/BLUE SHIELD | Admitting: Family Medicine

## 2015-12-07 VITALS — BP 116/77 | HR 52 | Temp 97.7°F | Resp 20 | Ht 73.0 in | Wt 195.5 lb

## 2015-12-07 DIAGNOSIS — Z0000 Encounter for general adult medical examination without abnormal findings: Secondary | ICD-10-CM

## 2015-12-07 DIAGNOSIS — Z79899 Other long term (current) drug therapy: Secondary | ICD-10-CM | POA: Diagnosis not present

## 2015-12-07 DIAGNOSIS — Z13220 Encounter for screening for lipoid disorders: Secondary | ICD-10-CM

## 2015-12-07 DIAGNOSIS — E785 Hyperlipidemia, unspecified: Secondary | ICD-10-CM

## 2015-12-07 DIAGNOSIS — Z130 Encounter for screening for diseases of the blood and blood-forming organs and certain disorders involving the immune mechanism: Secondary | ICD-10-CM

## 2015-12-07 DIAGNOSIS — Z23 Encounter for immunization: Secondary | ICD-10-CM | POA: Diagnosis not present

## 2015-12-07 DIAGNOSIS — E781 Pure hyperglyceridemia: Secondary | ICD-10-CM

## 2015-12-07 DIAGNOSIS — Z6825 Body mass index (BMI) 25.0-25.9, adult: Secondary | ICD-10-CM

## 2015-12-07 DIAGNOSIS — K219 Gastro-esophageal reflux disease without esophagitis: Secondary | ICD-10-CM

## 2015-12-07 LAB — BASIC METABOLIC PANEL
BUN: 17 mg/dL (ref 6–23)
CALCIUM: 10 mg/dL (ref 8.4–10.5)
CO2: 30 mEq/L (ref 19–32)
CREATININE: 1.05 mg/dL (ref 0.40–1.50)
Chloride: 103 mEq/L (ref 96–112)
GFR: 79.68 mL/min (ref >60.00)
GLUCOSE: 87 mg/dL (ref 70–99)
Potassium: 4.9 mEq/L (ref 3.5–5.1)
Sodium: 140 mEq/L (ref 135–145)

## 2015-12-07 LAB — CBC WITH DIFFERENTIAL/PLATELET
BASOS PCT: 0.5 % (ref 0.0–3.0)
Basophils Absolute: 0 10*3/uL (ref 0.0–0.1)
EOS PCT: 2 % (ref 0.0–5.0)
Eosinophils Absolute: 0.2 10*3/uL (ref 0.0–0.7)
HCT: 47.4 % (ref 39.0–52.0)
HEMOGLOBIN: 16 g/dL (ref 13.0–17.0)
LYMPHS ABS: 2.7 10*3/uL (ref 0.7–4.0)
Lymphocytes Relative: 32.2 % (ref 12.0–46.0)
MCHC: 33.7 g/dL (ref 30.0–36.0)
MCV: 93.8 fl (ref 78.0–100.0)
MONO ABS: 0.6 10*3/uL (ref 0.1–1.0)
Monocytes Relative: 7 % (ref 3.0–12.0)
Neutro Abs: 4.9 10*3/uL (ref 1.4–7.7)
Neutrophils Relative %: 58.3 % (ref 43.0–77.0)
Platelets: 264 10*3/uL (ref 150.0–400.0)
RBC: 5.05 Mil/uL (ref 4.22–5.81)
RDW: 12.9 % (ref 11.5–15.5)
WBC: 8.5 10*3/uL (ref 4.0–10.5)

## 2015-12-07 LAB — LIPID PANEL
Cholesterol: 240 mg/dL — ABNORMAL HIGH (ref 0–200)
HDL: 51 mg/dL (ref >39.00)
LDL Cholesterol: 162 mg/dL — ABNORMAL HIGH (ref 0–99)
NONHDL: 189.4
Total CHOL/HDL Ratio: 5
Triglycerides: 139 mg/dL (ref 0.0–149.0)
VLDL: 27.8 mg/dL (ref 0.0–40.0)

## 2015-12-07 LAB — VITAMIN D 25 HYDROXY (VIT D DEFICIENCY, FRACTURES): VITD: 23.26 ng/mL — AB (ref 30.00–100.00)

## 2015-12-07 LAB — MAGNESIUM: MAGNESIUM: 2.1 mg/dL (ref 1.5–2.5)

## 2015-12-07 LAB — VITAMIN B12: VITAMIN B 12: 309 pg/mL (ref 211–911)

## 2015-12-07 MED ORDER — OMEPRAZOLE 40 MG PO CPDR: 40.0000 mg | DELAYED_RELEASE_CAPSULE | Freq: Every day | ORAL | 3 refills | Status: DC

## 2015-12-07 NOTE — Progress Notes (Signed)
Patient ID: Warren Foley., male  DOB: 06-04-1965, 50 y.o.   MRN: [id number] Patient Care Team    Relationship Specialty Notifications Start End  [name], DO PCP - General Family Medicine  09/29/14     Subjective:  Warren Boodram. is a 50 y.o.  Male  present for CPE. All past medical history, surgical history, allergies, family history, immunizations, medications and social history were updated in the electronic medical record today. All recent labs, ED visits and hospitalizations within the last year were reviewed.   Hyperlipidemia: pt has made some dietary changes cutting out pasta, higher fiber meals, exercising more frequently and lost weight. He has improved his BMI 3 points.  He has had elevated cholesterol  a few years in the row with hypertriglycerides.   GERD: he reports GERD symptoms for a few years. He has tried to lower his PPI dose in the past and sometimes can tolerate the 20 mg dose, but then eventually requires the 40 mg dose.   Health maintenance:  Colonoscopy: No FHX, Routine screening 16 (Dad with polyps)  Immunizations: flu shot today and td 2015 Infectious disease screening: HIV completed. Prostate: 2015, 0.93. 2016 0.79 With CT, normal no changes. Declines PSA today. No urinary changes.  Assistive device: none Oxygen use: None Patient has a Dental home. Hospitalizations/ED visits: none  Lipid Panel     Component Value Date/Time   CHOL 245 (H) 12/04/2014 0833   TRIG 263.0 (H) 12/04/2014 0833   HDL 42.20 12/04/2014 0833   CHOLHDL 6 12/04/2014 0833   VLDL 52.6 (H) 12/04/2014 0833   LDLDIRECT 137.0 12/04/2014 0833     Immunization History  Administered Date(s) Administered  . Influenza,inj,Quad PF,36+ Mos 11/27/2013, 10/21/2014, 12/07/2015  . Tdap 11/27/2013     Past Medical History:  Diagnosis Date  . Allergy    Seasonal  . Frequent headaches   . Lumbar herniated disc 2001   conservative treatment  . Migraines   . Seasonal  asthma    No Known Allergies Past Surgical History:  Procedure Laterality Date  . ACHILLES TENDON REPAIR    . LASIK     Family History  Problem Relation Age of Onset  . COPD Mother   . Hypertension Father   . COPD Maternal Grandmother   . Multiple sclerosis Maternal Grandfather   . Cancer Paternal Grandfather     lung   Social History   Social History  . Marital status: Married    Spouse name: N/A  . Number of children: 2  . Years of education: Bachelor D   Occupational History  . [employer]  .  [place] History Main Topics  . Smoking status: Never Smoker  . Smokeless tobacco: Never Used  . Alcohol use Yes  . Drug use: No  . Sexual activity: Yes   Other Topics Concern  . Not on file   Social History Narrative   Mr Mccalla lives with his daughter. He works Medical laboratory scientific officer.     Medication List       Accurate as of 12/07/15  9:09 AM. Always use your most recent med list.          albuterol 108 (90 Base) MCG/ACT inhaler Commonly known as:  PROVENTIL HFA;VENTOLIN HFA Inhale 2 puffs into the lungs every 4 (four) hours as needed for wheezing.   gabapentin 300 MG capsule Commonly known as:  NEURONTIN Take 300 mg by mouth  3 (three) times daily. 1 T am, 2 T afternoon, 1 T pm   omeprazole 40 MG capsule Commonly known as:  PRILOSEC Take 1 capsule (40 mg total) by mouth daily.   SUMAtriptan 100 MG tablet Commonly known as:  IMITREX Take 1 tablet (100 mg total) by mouth once. May repeat in 2 hours if headache persists or recurs.        No results found for this or any previous visit (from the past 2160 hour(s)).  Dg Thoracic Spine W/swimmers  Result Date: 10/21/2014 CLINICAL DATA:  Right side thoracic and lumbar spine pain for the past 2 weeks after moving some furniture. EXAM: THORACIC SPINE - 3 VIEWS COMPARISON:  Chest radiographs dated 12/08/2012. FINDINGS: Anterior and lateral spur formation at multiple levels of the lower  thoracic spine. Mild lower cervical spine degenerative changes. No fractures or subluxations. Minimal scoliosis. IMPRESSION: Degenerative changes.  No fracture or subluxation. Electronically Signed   By: Claudie Revering M.D.   On: 10/21/2014 13:25   Dg Lumbar Spine Complete  Result Date: 10/21/2014 CLINICAL DATA:  Thoracic and lumbar spine pain since moving furniture 2 weeks ago. EXAM: LUMBAR SPINE - COMPLETE 4+ VIEW COMPARISON:  01/23/2008 and lumbar spine MR dated 12/16/2013. FINDINGS: Five non-rib-bearing lumbar vertebrae. Mild anterior and lateral spur formation throughout the lumbar and lower thoracic spine. No fractures, pars defects or subluxations. IMPRESSION: Mild multilevel degenerative changes.  No acute abnormality. Electronically Signed   By: Claudie Revering M.D.   On: 10/21/2014 13:27     ROS: 14 pt review of systems performed and negative (unless mentioned in an HPI)  Objective: BP 116/77 (BP Location: Left Arm, Patient Position: Sitting, Cuff Size: Large)   Pulse (!) 52   Temp 97.7 F (36.5 C)   Resp 20   Ht 6\' 1"  (1.854 m)   Wt 195 lb 8 oz (88.7 kg)   SpO2 98%   BMI 25.79 kg/m  Gen: Afebrile. No acute distress. Nontoxic in appearance, well-developed, well-nourished, pleasant caucasian.  HENT: AT. [place]. Bilateral TM visualized and normal in appearance, normal external auditory canal. MMM, no oral lesions, adequate dentition. Bilateral nares within normal limits. Throat without erythema, ulcerations or exudates. no Cough on exam, no hoarseness on exam. Eyes:Pupils Equal Round Reactive to light, Extraocular movements intact,  Conjunctiva without redness, discharge or icterus. Neck/lymp/endocrine: Supple No  lymphadenopathy, no thyromegaly CV: RRR no murmur, noedema, +2/4 P posterior tibialis pulses. no carotid bruits. No JVD. Chest: CTAB, no wheeze, rhonchi or crackles. Normal  Respiratory effort. good Air movement. Abd: Soft. flat. NTND. BS present. no Masses palpated. No  hepatosplenomegaly. No rebound tenderness or guarding. Skin: no rashes, purpura or petechiae. Warm and well-perfused. Skin intact. Neuro/Msk:  Normal gait. PERLA. EOMi. Alert. Oriented x3.  Cranial nerves II through XII intact. Muscle strength 5/5 upper/lower extremity. DTRs equal bilaterally. Psych: Normal affect, dress and demeanor. Normal speech. Normal thought content and judgment.  Assessment/plan: Warren Ancira. is a 50 y.o. male present for CPE . Encounter for preventive health examination Patient was encouraged to exercise greater than 150 minutes a week. Patient was encouraged to choose a diet filled with fresh fruits and vegetables, and lean meats. AVS provided to patient today for education/recommendation on gender specific health and safety maintenance. Need for prophylactic vaccination and inoculation against influenza - Flu Vaccine QUAD 36+ mos PF IM  Lipid screening - Lipid panel Screening for iron deficiency anemia - CBC w/Diff Encounter for long-term current use of  medication - Basic Metabolic Panel (BMET) - Magnesium - B12 - Vitamin D (25 hydroxy) Gastroesophageal reflux disease, esophagitis presence not specified - continue to try to take the least amount PPI needed. Try QOD dosing if able. Continue dietary restrictitions.  - omeprazole (PRILOSEC) 40 MG capsule; Take 1 capsule (40 mg total) by mouth daily.  Dispense: 90 capsule; Refill: 3 - Vit d, b12 and mag levels.  Hyperlipidemia, unspecified hyperlipidemia type/Hypertriglyceridemia - No Fhx heart disease. No other co-morbidities. He has declined statin in the past and has made great exercise and dietary changes over the last year.  - recheck lipids today.  BMI 25.0-25.9,adult - great job on the weight loss and exercise.   Return in about 1 year (around 12/06/2016) for CPE.  Electronically signed by: Howard Pouch, DO [place]

## 2015-12-07 NOTE — Patient Instructions (Signed)

## 2015-12-08 ENCOUNTER — Telehealth: Payer: Self-pay | Admitting: Family Medicine

## 2015-12-08 NOTE — Telephone Encounter (Signed)
Please call pt: - his cholesterol is better, but mildly elevated.  - if he is not taking fish oil I would suggest he start. He is exercising and making dietary changes, so some of this is likely heredity. It is not high enough to need a "cholesterol medication".  - his vit d is mildly low, goal 40, his is 21. I would suggest an OTC 800-1000u daily with a meal.    Lipid Panel     Component Value Date/Time   CHOL 240 (H) 12/07/2015 0859   TRIG 139.0 12/07/2015 0859   HDL 51.00 12/07/2015 0859   CHOLHDL 5 12/07/2015 0859   VLDL 27.8 12/07/2015 0859   LDLCALC 162 (H) 12/07/2015 0859   LDLDIRECT 137.0 12/04/2014 [id number]

## 2015-12-08 NOTE — Telephone Encounter (Signed)
Left detailed message with resuts and instructions on patient voice mail per [facility]

## 2016-03-14 ENCOUNTER — Other Ambulatory Visit: Payer: Self-pay | Admitting: *Deleted

## 2016-03-14 DIAGNOSIS — K219 Gastro-esophageal reflux disease without esophagitis: Secondary | ICD-10-CM

## 2016-03-14 MED ORDER — OMEPRAZOLE 40 MG PO CPDR: 40.0000 mg | DELAYED_RELEASE_CAPSULE | Freq: Every day | ORAL | 0 refills | Status: DC

## 2016-03-16 ENCOUNTER — Other Ambulatory Visit: Payer: Self-pay

## 2016-03-16 DIAGNOSIS — G43009 Migraine without aura, not intractable, without status migrainosus: Secondary | ICD-10-CM

## 2016-03-16 MED ORDER — SUMATRIPTAN SUCCINATE 100 MG PO TABS: 100.0000 mg | ORAL_TABLET | Freq: Once | ORAL | 2 refills | Status: DC

## 2016-03-16 NOTE — Telephone Encounter (Signed)
Refill sent.

## 2016-03-17 ENCOUNTER — Other Ambulatory Visit: Payer: Self-pay | Admitting: *Deleted

## 2016-03-17 DIAGNOSIS — G43009 Migraine without aura, not intractable, without status migrainosus: Secondary | ICD-10-CM

## 2016-03-17 MED ORDER — SUMATRIPTAN SUCCINATE 100 MG PO TABS: 100.0000 mg | ORAL_TABLET | Freq: Once | ORAL | 2 refills | Status: DC

## 2016-06-10 ENCOUNTER — Other Ambulatory Visit: Payer: Self-pay | Admitting: *Deleted

## 2016-06-10 DIAGNOSIS — K219 Gastro-esophageal reflux disease without esophagitis: Secondary | ICD-10-CM

## 2016-06-10 MED ORDER — OMEPRAZOLE 40 MG PO CPDR: 40.0000 mg | DELAYED_RELEASE_CAPSULE | Freq: Every day | ORAL | 0 refills | Status: DC

## 2016-06-20 ENCOUNTER — Other Ambulatory Visit: Payer: Self-pay | Admitting: *Deleted

## 2016-06-20 DIAGNOSIS — K219 Gastro-esophageal reflux disease without esophagitis: Secondary | ICD-10-CM

## 2016-06-20 MED ORDER — OMEPRAZOLE 40 MG PO CPDR: 40.0000 mg | DELAYED_RELEASE_CAPSULE | Freq: Every day | ORAL | 0 refills | Status: DC

## 2016-09-22 DIAGNOSIS — M542 Cervicalgia: Secondary | ICD-10-CM | POA: Diagnosis not present

## 2016-09-22 DIAGNOSIS — M4722 Other spondylosis with radiculopathy, cervical region: Secondary | ICD-10-CM | POA: Diagnosis not present

## 2016-09-22 DIAGNOSIS — M5412 Radiculopathy, cervical region: Secondary | ICD-10-CM | POA: Diagnosis not present

## 2016-09-22 DIAGNOSIS — Z6825 Body mass index (BMI) 25.0-25.9, adult: Secondary | ICD-10-CM | POA: Diagnosis not present

## 2016-09-23 ENCOUNTER — Other Ambulatory Visit: Payer: Self-pay | Admitting: *Deleted

## 2016-09-23 DIAGNOSIS — K219 Gastro-esophageal reflux disease without esophagitis: Secondary | ICD-10-CM

## 2016-09-23 MED ORDER — OMEPRAZOLE 40 MG PO CPDR: 40.0000 mg | DELAYED_RELEASE_CAPSULE | Freq: Every day | ORAL | 0 refills | Status: DC

## 2016-11-21 DIAGNOSIS — M5136 Other intervertebral disc degeneration, lumbar region: Secondary | ICD-10-CM | POA: Diagnosis not present

## 2016-11-21 DIAGNOSIS — M5432 Sciatica, left side: Secondary | ICD-10-CM | POA: Diagnosis not present

## 2016-11-21 DIAGNOSIS — M4726 Other spondylosis with radiculopathy, lumbar region: Secondary | ICD-10-CM | POA: Diagnosis not present

## 2016-11-21 DIAGNOSIS — M5106 Intervertebral disc disorders with myelopathy, lumbar region: Secondary | ICD-10-CM | POA: Diagnosis not present

## 2016-11-22 DIAGNOSIS — M5432 Sciatica, left side: Secondary | ICD-10-CM | POA: Diagnosis not present

## 2016-11-28 ENCOUNTER — Other Ambulatory Visit: Payer: Self-pay | Admitting: Rehabilitation

## 2016-11-28 DIAGNOSIS — M5136 Other intervertebral disc degeneration, lumbar region: Secondary | ICD-10-CM

## 2016-12-07 ENCOUNTER — Other Ambulatory Visit: Payer: Self-pay | Admitting: *Deleted

## 2016-12-07 DIAGNOSIS — G43009 Migraine without aura, not intractable, without status migrainosus: Secondary | ICD-10-CM

## 2016-12-07 MED ORDER — SUMATRIPTAN SUCCINATE 100 MG PO TABS: 100.0000 mg | ORAL_TABLET | Freq: Once | ORAL | 0 refills | Status: DC

## 2016-12-08 ENCOUNTER — Ambulatory Visit (INDEPENDENT_AMBULATORY_CARE_PROVIDER_SITE_OTHER): Payer: BLUE CROSS/BLUE SHIELD | Admitting: Family Medicine

## 2016-12-08 ENCOUNTER — Ambulatory Visit
Admission: RE | Admit: 2016-12-08 | Discharge: 2016-12-08 | Disposition: A | Discharge status: Home / Self Care | Payer: BLUE CROSS/BLUE SHIELD | Source: Ambulatory Visit | Attending: Rehabilitation | Admitting: Rehabilitation

## 2016-12-08 ENCOUNTER — Encounter: Payer: Self-pay | Admitting: Family Medicine

## 2016-12-08 ENCOUNTER — Encounter: Payer: Self-pay | Admitting: Gastroenterology

## 2016-12-08 VITALS — BP 134/84 | HR 68 | Temp 98.6°F | Resp 20 | Ht 73.0 in | Wt 203.2 lb

## 2016-12-08 DIAGNOSIS — Z1211 Encounter for screening for malignant neoplasm of colon: Secondary | ICD-10-CM | POA: Diagnosis not present

## 2016-12-08 DIAGNOSIS — M5126 Other intervertebral disc displacement, lumbar region: Secondary | ICD-10-CM | POA: Diagnosis not present

## 2016-12-08 DIAGNOSIS — E663 Overweight: Secondary | ICD-10-CM

## 2016-12-08 DIAGNOSIS — E785 Hyperlipidemia, unspecified: Secondary | ICD-10-CM

## 2016-12-08 DIAGNOSIS — Z0000 Encounter for general adult medical examination without abnormal findings: Secondary | ICD-10-CM

## 2016-12-08 DIAGNOSIS — G43009 Migraine without aura, not intractable, without status migrainosus: Secondary | ICD-10-CM | POA: Diagnosis not present

## 2016-12-08 DIAGNOSIS — M5136 Other intervertebral disc degeneration, lumbar region: Secondary | ICD-10-CM

## 2016-12-08 DIAGNOSIS — Z23 Encounter for immunization: Secondary | ICD-10-CM | POA: Diagnosis not present

## 2016-12-08 DIAGNOSIS — K219 Gastro-esophageal reflux disease without esophagitis: Secondary | ICD-10-CM

## 2016-12-08 DIAGNOSIS — E559 Vitamin D deficiency, unspecified: Secondary | ICD-10-CM | POA: Insufficient documentation

## 2016-12-08 DIAGNOSIS — Z125 Encounter for screening for malignant neoplasm of prostate: Secondary | ICD-10-CM

## 2016-12-08 DIAGNOSIS — E781 Pure hyperglyceridemia: Secondary | ICD-10-CM | POA: Diagnosis not present

## 2016-12-08 LAB — COMPREHENSIVE METABOLIC PANEL
ALBUMIN: 4.6 g/dL (ref 3.5–5.2)
ALT: 22 U/L (ref 0–53)
AST: 19 U/L (ref 0–37)
Alkaline Phosphatase: 42 U/L (ref 39–117)
BUN: 20 mg/dL (ref 6–23)
CALCIUM: 10.2 mg/dL (ref 8.4–10.5)
CHLORIDE: 102 meq/L (ref 96–112)
CO2: 30 mEq/L (ref 19–32)
Creatinine, Ser: 1.07 mg/dL (ref 0.40–1.50)
GFR: 77.64 mL/min (ref >60.00)
Glucose, Bld: 92 mg/dL (ref 70–99)
POTASSIUM: 4.9 meq/L (ref 3.5–5.1)
Sodium: 138 mEq/L (ref 135–145)
Total Bilirubin: 0.7 mg/dL (ref 0.2–1.2)
Total Protein: 7.1 g/dL (ref 6.0–8.3)

## 2016-12-08 LAB — CBC WITH DIFFERENTIAL/PLATELET
BASOS PCT: 0.7 % (ref 0.0–3.0)
Basophils Absolute: 0.1 10*3/uL (ref 0.0–0.1)
EOS PCT: 2.9 % (ref 0.0–5.0)
Eosinophils Absolute: 0.2 10*3/uL (ref 0.0–0.7)
HEMATOCRIT: 47.4 % (ref 39.0–52.0)
HEMOGLOBIN: 16.1 g/dL (ref 13.0–17.0)
LYMPHS PCT: 37.8 % (ref 12.0–46.0)
Lymphs Abs: 2.9 10*3/uL (ref 0.7–4.0)
MCHC: 34 g/dL (ref 30.0–36.0)
MCV: 95.8 fl (ref 78.0–100.0)
Monocytes Absolute: 0.6 10*3/uL (ref 0.1–1.0)
Monocytes Relative: 8.4 % (ref 3.0–12.0)
Neutro Abs: 3.9 10*3/uL (ref 1.4–7.7)
Neutrophils Relative %: 50.2 % (ref 43.0–77.0)
Platelets: 308 10*3/uL (ref 150.0–400.0)
RBC: 4.95 Mil/uL (ref 4.22–5.81)
RDW: 12.9 % (ref 11.5–15.5)
WBC: 7.7 10*3/uL (ref 4.0–10.5)

## 2016-12-08 LAB — LIPID PANEL
CHOLESTEROL: 319 mg/dL — AB (ref 0–200)
HDL: 52.3 mg/dL (ref >39.00)
NonHDL: 267.09
Total CHOL/HDL Ratio: 6
Triglycerides: 204 mg/dL — ABNORMAL HIGH (ref 0.0–149.0)
VLDL: 40.8 mg/dL — AB (ref 0.0–40.0)

## 2016-12-08 LAB — LDL CHOLESTEROL, DIRECT: Direct LDL: 210 mg/dL

## 2016-12-08 LAB — VITAMIN D 25 HYDROXY (VIT D DEFICIENCY, FRACTURES): VITD: 39.88 ng/mL (ref 30.00–100.00)

## 2016-12-08 LAB — HEMOGLOBIN A1C: HEMOGLOBIN A1C: 5.3 % (ref 4.6–6.5)

## 2016-12-08 LAB — PSA: PSA: 4.13 ng/mL — ABNORMAL HIGH (ref 0.10–4.00)

## 2016-12-08 MED ORDER — SUMATRIPTAN SUCCINATE 100 MG PO TABS: 100.0000 mg | ORAL_TABLET | Freq: Once | ORAL | 5 refills | Status: DC

## 2016-12-08 MED ORDER — OMEPRAZOLE 40 MG PO CPDR: 40.0000 mg | DELAYED_RELEASE_CAPSULE | Freq: Every day | ORAL | 0 refills | Status: DC

## 2016-12-08 NOTE — Progress Notes (Signed)
Patient ID: Warren Hall., male  DOB: 1965/09/10, 51 y.o.   MRN: [id number] Patient Care Team    Relationship Specialty Notifications Start End  [name], DO PCP - General Family Medicine  09/29/14   Zonia Kief, MD  Rehabilitation  12/09/16     Subjective:  Warren Hall. is a 51 y.o.  Male  present for CPE. All past medical history, surgical history, allergies, family history, immunizations, medications and social history were updated in the electronic medical record today. All recent labs, ED visits and hospitalizations within the last year were reviewed.   Hyperlipidemia: Patient reports he has been taking fish oil supplement, he doesn't recall the amount. He typically exercises rather routinely, but he has been suffering from his lower back issues and has not been exercising as much is normal.    Health maintenance: Updated 12/08/2016 Colonoscopy: No FHX, Routine screening 50 (Dad with polyps) --> order placed today.  Immunizations: flu shot completed today and td UTD 2015 Infectious disease screening: HIV completed. Prostate: 2015, 0.93. 2016 0.79 With CT, normal no changes.  No urinary changes reported today. Assistive device: None  Oxygen use: None Patient has a Dental home. Hospitalizations/ED visits: None  Lipid Panel     Component Value Date/Time   CHOL 319 (H) 12/08/2016 1039   TRIG 204.0 (H) 12/08/2016 1039   HDL 52.30 12/08/2016 1039   CHOLHDL 6 12/08/2016 1039   VLDL 40.8 (H) 12/08/2016 1039   LDLCALC 162 (H) 12/07/2015 0859   LDLDIRECT 210.0 12/08/2016 1039     Immunization History  Administered Date(s) Administered  . Influenza,inj,Quad PF,6+ Mos 11/27/2013, 10/21/2014, 12/07/2015  . Tdap 11/27/2013     Past Medical History:  Diagnosis Date  . Allergy    Seasonal  . Frequent headaches   . Lumbar herniated disc 2001   conservative treatment  . Migraines   . Seasonal asthma    No Known Allergies Past Surgical History:    Procedure Laterality Date  . ACHILLES TENDON REPAIR    . LASIK     Family History  Problem Relation Age of Onset  . COPD Mother   . Hypertension Father   . COPD Maternal Grandmother   . Multiple sclerosis Maternal Grandfather   . Cancer Paternal Grandfather        lung   Social History   Socioeconomic History  . Marital status: Married    Spouse name: Not on file  . Number of children: 2  . Years of education: Bachelor D  . Highest education level: Not on file  Social Needs  . Financial resource strain: Not on file  . Food insecurity - worry: Not on file  . Food insecurity - inability: Not on file  . Transportation needs - medical: Not on file  . Transportation needs - non-medical: Not on file  Occupational History  . Occupation: Scientist, clinical (histocompatibility and immunogenetics): [employer]    Employer: [employer]  Tobacco Use  . Smoking status: Never Smoker  . Smokeless tobacco: Never Used  Substance and Sexual Activity  . Alcohol use: Yes  . Drug use: No  . Sexual activity: Yes  Other Topics Concern  . Not on file  Social History Narrative   Mr Warren Hall lives with his daughter. He works Medical laboratory scientific officer.   Allergies as of 12/08/2016   No Known Allergies     Medication List        Accurate as of 12/08/16  11:59 PM. Always use your most recent med list.          albuterol 108 (90 Base) MCG/ACT inhaler Commonly known as:  PROVENTIL HFA;VENTOLIN HFA Inhale 2 puffs into the lungs every 4 (four) hours as needed for wheezing.   gabapentin 300 MG capsule Commonly known as:  NEURONTIN Take 300 mg by mouth 3 (three) times daily. 1 T am, 2 T afternoon, 1 T pm   omeprazole 40 MG capsule Commonly known as:  PRILOSEC Take 1 capsule (40 mg total) daily by mouth.   SUMAtriptan 100 MG tablet Commonly known as:  IMITREX Take 1 tablet (100 mg total) once for 1 dose by mouth. May repeat in 2 hours if headache persists or recurs.        Recent Results (from the past 2160 hour(s))   CBC w/Diff     Status: None   Collection Time: 12/08/16 10:39 AM  Result Value Ref Range   WBC 7.7 4.0 - 10.5 K/uL   RBC 4.95 4.22 - 5.81 Mil/uL   Hemoglobin 16.1 13.0 - 17.0 g/dL   HCT 47.4 39.0 - 52.0 %   MCV 95.8 78.0 - 100.0 fl   MCHC 34.0 30.0 - 36.0 g/dL   RDW 12.9 11.5 - 15.5 %   Platelets 308.0 150.0 - 400.0 K/uL   Neutrophils Relative % 50.2 43.0 - 77.0 %   Lymphocytes Relative 37.8 12.0 - 46.0 %   Monocytes Relative 8.4 3.0 - 12.0 %   Eosinophils Relative 2.9 0.0 - 5.0 %   Basophils Relative 0.7 0.0 - 3.0 %   Neutro Abs 3.9 1.4 - 7.7 K/uL   Lymphs Abs 2.9 0.7 - 4.0 K/uL   Monocytes Absolute 0.6 0.1 - 1.0 K/uL   Eosinophils Absolute 0.2 0.0 - 0.7 K/uL   Basophils Absolute 0.1 0.0 - 0.1 K/uL  Comp Met (CMET)     Status: None   Collection Time: 12/08/16 10:39 AM  Result Value Ref Range   Sodium 138 135 - 145 mEq/L   Potassium 4.9 3.5 - 5.1 mEq/L   Chloride 102 96 - 112 mEq/L   CO2 30 19 - 32 mEq/L   Glucose, Bld 92 70 - 99 mg/dL   BUN 20 6 - 23 mg/dL   Creatinine, Ser 1.07 0.40 - 1.50 mg/dL   Total Bilirubin 0.7 0.2 - 1.2 mg/dL   Alkaline Phosphatase 42 39 - 117 U/L   AST 19 0 - 37 U/L   ALT 22 0 - 53 U/L   Total Protein 7.1 6.0 - 8.3 g/dL   Albumin 4.6 3.5 - 5.2 g/dL   Calcium 10.2 8.4 - 10.5 mg/dL   GFR 77.64 >60.00 mL/min  HgB A1c     Status: None   Collection Time: 12/08/16 10:39 AM  Result Value Ref Range   Hgb A1c MFr Bld 5.3 4.6 - 6.5 %    Comment: Glycemic Control Guidelines for People with Diabetes:Non Diabetic:  <6%Goal of Therapy: <7%Additional Action Suggested:  >8%   Lipid panel     Status: Abnormal   Collection Time: 12/08/16 10:39 AM  Result Value Ref Range   Cholesterol 319 (H) 0 - 200 mg/dL    Comment: ATP III Classification       Desirable:  < 200 mg/dL               Borderline High:  200 - 239 mg/dL          High:  > = 240  mg/dL   Triglycerides 204.0 (H) 0.0 - 149.0 mg/dL    Comment: Normal:  <150 mg/dLBorderline High:  150 - 199 mg/dL    HDL 52.30 >39.00 mg/dL   VLDL 40.8 (H) 0.0 - 40.0 mg/dL   Total CHOL/HDL Ratio 6     Comment:                Men          Women1/2 Average Risk     3.4          3.3Average Risk          5.0          4.42X Average Risk          9.6          7.13X Average Risk          15.0          11.0                       NonHDL 267.09     Comment: NOTE:  Non-HDL goal should be 30 mg/dL higher than patient's LDL goal (i.e. LDL goal of < 70 mg/dL, would have non-HDL goal of < 100 mg/dL)  PSA     Status: Abnormal   Collection Time: 12/08/16 10:39 AM  Result Value Ref Range   PSA 4.13 (H) 0.10 - 4.00 ng/mL    Comment: Test performed using Access Hybritech PSA Assay, a parmagnetic partical, chemiluminecent immunoassay.  VITAMIN D 25 Hydroxy (Vit-D Deficiency, Fractures)     Status: None   Collection Time: 12/08/16 10:39 AM  Result Value Ref Range   VITD 39.88 30.00 - 100.00 ng/mL  LDL cholesterol, direct     Status: None   Collection Time: 12/08/16 10:39 AM  Result Value Ref Range   Direct LDL 210.0 mg/dL    Comment: Optimal:  <100 mg/dLNear or Above Optimal:  100-129 mg/dLBorderline High:  130-159 mg/dLHigh:  160-189 mg/dLVery High:  >190 mg/dL      ROS: 14 pt review of systems performed and negative (unless mentioned in an HPI)  Objective: BP 134/84 (BP Location: Right Arm, Patient Position: Sitting, Cuff Size: Large)   Pulse 68   Temp 98.6 F (37 C)   Resp 20   Ht _0  (1.854 m)   Wt 203 lb 4 oz (92.2 kg)   SpO2 97%   BMI 26.82 kg/m  Gen: Afebrile. No acute distress. Nontoxic in appearance, well-developed, well-nourished, Caucasian male. HENT: AT. [place]. Bilateral TM visualized and normal in appearance. MMM. Bilateral nares without erythema or swelling or drainage. Throat without erythema or exudates. No cough, no hoarseness. Eyes:Pupils Equal Round Reactive to light, Extraocular movements intact,  Conjunctiva without redness, discharge or icterus. Neck/lymp/endocrine: Supple, no  lymphadenopathy, no thyromegaly CV: RRR no murmur, no edema, +2/4 P posterior tibialis pulses Chest: CTAB, no wheeze or crackles Abd: Soft. Flat. NTND. BS present. No Masses palpated.  MSK: No erythema, no soft tissue swelling. Normal range of motion. Neurovascular intact distally. Skin: No rashes, purpura or petechiae.  Neuro:  Normal gait. PERLA. EOMi. Alert. Oriented x3 Cranial nerves II through XII intact. Muscle strength 5/5 upper and lower extremity. DTRs equal bilaterally. Psych: Normal affect, dress and demeanor. Normal speech. Normal thought content and judgment.    Assessment/plan: Lance [place]aracha. is a 51 y.o. male present for CPE . Gastroesophageal reflux disease, esophagitis presence not specified Vitamin D deficiency - Stable.  Still needing daily Prilosec. Refills provided today.  - Patient has been taking a vitamin D supplement, is uncertain the amount. - Follow B-12, vitamin D and magnesium at least every 3 years. Hyperlipidemia, unspecified hyperlipidemia type/Hypertriglyceridemia Overweight (BMI 25.0-29.9) - No Fhx heart disease. No other co-morbidities.  - He has been supplementing with fish oil, he is uncertain the dosage. - Lipid collection today. - CBC w/Diff - Comp Met (CMET) - HgB A1c - Lipid panel Screening for prostate cancer - PSA Influenza vaccine administered - Flu Vaccine QUAD 6+ mos PF IM (Fluarix Quad PF) Migraine without aura and without status migrainosus, not intractable - SUMAtriptan (IMITREX) 100 MG tablet; Take 1 tablet (100 mg total) once for 1 dose by mouth. May repeat in 2 hours if headache persists or recurs.  Dispense: 15 tablet; Refill: 5 - Briefly discussed there are other options if his headaches become more routine. Currently taking sometimes up to 1-2 times a week. Colon cancer screening - Ambulatory referral to Gastroenterology Encounter for preventive health examination Patient was encouraged to exercise greater than 150  minutes a week. Patient was encouraged to choose a diet filled with fresh fruits and vegetables, and lean meats. AVS provided to patient today for education/recommendation on gender specific health and safety maintenance. Colonoscopy: No FHX, Routine screening 50 (Dad with polyps) --> order placed today.  Immunizations: flu shot completed today and td UTD 2015 Infectious disease screening: HIV completed. Prostate: 2015, 0.93. 2016 0.79 With CT, normal no changes.  No urinary changes reported today. Screening ordered today.   Return in about 1 year (around 12/08/2017) for CPE.  Electronically signed by: Howard Pouch, DO [place]

## 2016-12-08 NOTE — Patient Instructions (Signed)
When you get home please look at the doses of Vit d and fish oil you take, so when we call you with results we can guide you.    Health Maintenance, Male A healthy lifestyle and preventive care is important for your health and wellness. Ask your health care provider about what schedule of regular examinations is right for you. What should I know about weight and diet? Eat a Healthy Diet  Eat plenty of vegetables, fruits, whole grains, low-fat dairy products, and lean protein.  Do not eat a lot of foods high in solid fats, added sugars, or salt.  Maintain a Healthy Weight Regular exercise can help you achieve or maintain a healthy weight. You should:  Do at least 150 minutes of exercise each week. The exercise should increase your heart rate and make you sweat (moderate-intensity exercise).  Do strength-training exercises at least twice a week.  Watch Your Levels of Cholesterol and Blood Lipids  Have your blood tested for lipids and cholesterol every 5 years starting at 50 years of age. If you are at high risk for heart disease, you should start having your blood tested when you are 50 years old. You may need to have your cholesterol levels checked more often if: ? Your lipid or cholesterol levels are high. ? You are older than 50 years of age. ? You are at high risk for heart disease.  What should I know about cancer screening? Many types of cancers can be detected early and may often be prevented. Lung Cancer  You should be screened every year for lung cancer if: ? You are a current smoker who has smoked for at least 30 years. ? You are a former smoker who has quit within the past 15 years.  Talk to your health care provider about your screening options, when you should start screening, and how often you should be screened.  Colorectal Cancer  Routine colorectal cancer screening usually begins at 50 years of age and should be repeated every 5-10 years until you are 50 years  old. You may need to be screened more often if early forms of precancerous polyps or small growths are found. Your health care provider may recommend screening at an earlier age if you have risk factors for colon cancer.  Your health care provider may recommend using home test kits to check for hidden blood in the stool.  A small camera at the end of a tube can be used to examine your colon (sigmoidoscopy or colonoscopy). This checks for the earliest forms of colorectal cancer.  Prostate and Testicular Cancer  Depending on your age and overall health, your health care provider may do certain tests to screen for prostate and testicular cancer.  Talk to your health care provider about any symptoms or concerns you have about testicular or prostate cancer.  Skin Cancer  Check your skin from head to toe regularly.  Tell your health care provider about any new moles or changes in moles, especially if: ? There is a change in a mole's size, shape, or color. ? You have a mole that is larger than a pencil eraser.  Always use sunscreen. Apply sunscreen liberally and repeat throughout the day.  Protect yourself by wearing long sleeves, pants, a wide-brimmed hat, and sunglasses when outside.  What should I know about heart disease, diabetes, and high blood pressure?  If you are 77-35 years of age, have your blood pressure checked every 3-5 years. If you are 40  years of age or older, have your blood pressure checked every year. You should have your blood pressure measured twice-once when you are at a hospital or clinic, and once when you are not at a hospital or clinic. Record the average of the two measurements. To check your blood pressure when you are not at a hospital or clinic, you can use: ? An automated blood pressure machine at a pharmacy. ? A home blood pressure monitor.  Talk to your health care provider about your target blood pressure.  If you are between 18-5 years old, ask your  health care provider if you should take aspirin to prevent heart disease.  Have regular diabetes screenings by checking your fasting blood sugar level. ? If you are at a normal weight and have a low risk for diabetes, have this test once every three years after the age of 32. ? If you are overweight and have a high risk for diabetes, consider being tested at a younger age or more often.  A one-time screening for abdominal aortic aneurysm (AAA) by ultrasound is recommended for men aged 86-75 years who are current or former smokers. What should I know about preventing infection? Hepatitis B If you have a higher risk for hepatitis B, you should be screened for this virus. Talk with your health care provider to find out if you are at risk for hepatitis B infection. Hepatitis C Blood testing is recommended for:  Everyone born from [birth year] through 1964.  Anyone with known risk factors for hepatitis C.  Sexually Transmitted Diseases (STDs)  You should be screened each year for STDs including gonorrhea and chlamydia if: ? You are sexually active and are younger than 50 years of age. ? You are older than 50 years of age and your health care provider tells you that you are at risk for this type of infection. ? Your sexual activity has changed since you were last screened and you are at an increased risk for chlamydia or gonorrhea. Ask your health care provider if you are at risk.  Talk with your health care provider about whether you are at high risk of being infected with HIV. Your health care provider may recommend a prescription medicine to help prevent HIV infection.  What else can I do?  Schedule regular health, dental, and eye exams.  Stay current with your vaccines (immunizations).  Do not use any tobacco products, such as cigarettes, chewing tobacco, and e-cigarettes. If you need help quitting, ask your health care provider.  Limit alcohol intake to no more than 2 drinks per day. One  drink equals 12 ounces of beer, 5 ounces of wine, or 1 ounces of hard liquor.  Do not use street drugs.  Do not share needles.  Ask your health care provider for help if you need support or information about quitting drugs.  Tell your health care provider if you often feel depressed.  Tell your health care provider if you have ever been abused or do not feel safe at home. This information is not intended to replace advice given to you by your health care provider. Make sure you discuss any questions you have with your health care provider. Document Released: 07/16/2007 Document Revised: 09/16/2015 Document Reviewed: 10/21/2014 Elsevier Interactive Patient Education  [employer].

## 2016-12-09 ENCOUNTER — Telehealth: Payer: Self-pay | Admitting: Family Medicine

## 2016-12-09 DIAGNOSIS — E785 Hyperlipidemia, unspecified: Secondary | ICD-10-CM

## 2016-12-09 MED ORDER — ATORVASTATIN CALCIUM 40 MG PO TABS: 40.0000 mg | ORAL_TABLET | Freq: Every day | ORAL | 3 refills | Status: DC

## 2016-12-09 NOTE — Telephone Encounter (Signed)
Left message for patient to return call.

## 2016-12-09 NOTE — Telephone Encounter (Signed)
Please cal pt: - His cholesterol level is extremely high, even higher than last year.  He is a great cardiac risk with his current levels. He absolutely should be on a cholesterol lowering medication now with a total cholesterol 319, LDL 210 and triglycerides 204. I have called liptor in for him to start nightly. F/U with provider appt and repeat fasting labs in 12 weeks.  - his prostate screen (PSA level) also changed a good fraction from last collection. It is now 4.13, which is just a little  above "normal", but It has more than quadrupled since last year which is not normal. Sometimes an abrupt elevation is a false positive because of prostate irritation in people that work out/bikers etc. Since he is not having urinary/prostate symptoms, I would like to retest his PSA at that 12 week appt as well, if it is abnormal again would refer to urology.  If any prostate pain/discomfort or change in urinary stream would want to see sooner.   - rest of labs are good.

## 2016-12-09 NOTE — Telephone Encounter (Signed)
Patient notified and appt scheduled. Verbalized understanding.

## 2016-12-14 DIAGNOSIS — M5432 Sciatica, left side: Secondary | ICD-10-CM | POA: Diagnosis not present

## 2016-12-16 ENCOUNTER — Ambulatory Visit (AMBULATORY_SURGERY_CENTER): Payer: Self-pay | Admitting: *Deleted

## 2016-12-16 ENCOUNTER — Other Ambulatory Visit: Payer: Self-pay

## 2016-12-16 VITALS — Ht 73.0 in | Wt 208.0 lb

## 2016-12-16 DIAGNOSIS — Z1211 Encounter for screening for malignant neoplasm of colon: Secondary | ICD-10-CM

## 2016-12-16 MED ORDER — NA SULFATE-K SULFATE-MG SULF 17.5-3.13-1.6 GM/177ML PO SOLN: 1.0000 [IU] | Freq: Once | ORAL | 0 refills | Status: AC

## 2016-12-16 NOTE — Progress Notes (Signed)
No egg or soy allergy known to patient  No issues with past sedation with any surgeries  or procedures, no intubation problems  No diet pills per patient No home 02 use per patient  No blood thinners per patient  Pt denies issues with constipation  No A fib or A flutter  EMMI video sent to pt's e mail pt declined   

## 2016-12-30 ENCOUNTER — Other Ambulatory Visit: Payer: Self-pay

## 2016-12-30 ENCOUNTER — Encounter: Payer: Self-pay | Admitting: Gastroenterology

## 2016-12-30 ENCOUNTER — Ambulatory Visit (AMBULATORY_SURGERY_CENTER): Payer: BLUE CROSS/BLUE SHIELD | Admitting: Gastroenterology

## 2016-12-30 VITALS — BP 101/72 | HR 52 | Temp 98.4°F | Resp 15 | Ht 73.0 in | Wt 203.0 lb

## 2016-12-30 DIAGNOSIS — K621 Rectal polyp: Secondary | ICD-10-CM

## 2016-12-30 DIAGNOSIS — D120 Benign neoplasm of cecum: Secondary | ICD-10-CM | POA: Diagnosis not present

## 2016-12-30 DIAGNOSIS — Z1211 Encounter for screening for malignant neoplasm of colon: Secondary | ICD-10-CM | POA: Diagnosis present

## 2016-12-30 DIAGNOSIS — D128 Benign neoplasm of rectum: Secondary | ICD-10-CM

## 2016-12-30 MED ORDER — SODIUM CHLORIDE 0.9 % IV SOLN: 500.0000 mL | INTRAVENOUS | Status: DC

## 2016-12-30 NOTE — Progress Notes (Signed)
A/ox3, pleased with MAC, report to  Suzanne RN 

## 2016-12-30 NOTE — Op Note (Signed)
[facility] Patient Name: Warren Hall Procedure Date: 12/30/2016 8:26 AM MRN: [id number] Endoscopist: Remo Lipps P. Luisangel Wainright MD, MD Age: 51 Referring MD:  Date of Birth: 03/31/1965 Gender: Male Account #: [id number] Procedure:                Colonoscopy Indications:              Screening for colorectal malignant neoplasm, This                            is the patient's first colonoscopy Medicines:                Monitored Anesthesia Care Procedure:                Pre-Anesthesia Assessment:                           - Prior to the procedure, a History and Physical                            was performed, and patient medications and                            allergies were reviewed. The patient's tolerance of                            previous anesthesia was also reviewed. The risks                            and benefits of the procedure and the sedation                            options and risks were discussed with the patient.                            All questions were answered, and informed consent                            was obtained. Prior Anticoagulants: The patient has                            taken no previous anticoagulant or antiplatelet                            agents. ASA Grade Assessment: II - A patient with                            mild systemic disease. After reviewing the risks                            and benefits, the patient was deemed in                            satisfactory condition to undergo the procedure.  After obtaining informed consent, the colonoscope                            was passed under direct vision. Throughout the                            procedure, the patient's blood pressure, pulse, and                            oxygen saturations were monitored continuously. The                            Model CF-HQ190L [id number]) scope was introduced                            through the anus and  advanced to the the cecum,                            identified by appendiceal orifice and ileocecal                            valve. The colonoscopy was performed without                            difficulty. The patient tolerated the procedure                            well. The quality of the bowel preparation was                            good. The ileocecal valve, appendiceal orifice, and                            rectum were photographed. Scope In: 8:36:27 AM Scope Out: 8:53:11 AM Scope Withdrawal Time: 0 hours 14 minutes 18 seconds  Total Procedure Duration: 0 hours 16 minutes 44 seconds  Findings:                 The perianal and digital rectal examinations were                            normal.                           A 5 mm polyp was found in the cecum. The polyp was                            sessile. The polyp was removed with a cold snare.                            Resection and retrieval were complete.                           A 3 mm polyp was found in the rectum. The polyp was  sessile. The polyp was removed with a cold snare.                            Resection and retrieval were complete.                           A few small-mouthed diverticula were found in the                            sigmoid colon.                           The exam was otherwise without abnormality. Complications:            No immediate complications. Estimated blood loss:                            Minimal. Estimated Blood Loss:     Estimated blood loss was minimal. Impression:               - One 5 mm polyp in the cecum, removed with a cold                            snare. Resected and retrieved.                           - One 3 mm polyp in the rectum, removed with a cold                            snare. Resected and retrieved.                           - Diverticulosis in the sigmoid colon.                           - The examination was otherwise  normal. Recommendation:           - Patient has a contact number available for                            emergencies. The signs and symptoms of potential                            delayed complications were discussed with the                            patient. Return to normal activities tomorrow.                            Written discharge instructions were provided to the                            patient.                           - Resume previous diet.                           -  Continue present medications.                           - Await pathology results.                           - Repeat colonoscopy is recommended for                            surveillance. The colonoscopy date will be                            determined after pathology results from today's                            exam become available for review. Remo Lipps P. Nakyiah Kuck MD, MD 12/30/2016 8:57:34 AM This report has been signed electronically.

## 2016-12-30 NOTE — Patient Instructions (Signed)
YOU HAD AN ENDOSCOPIC PROCEDURE TODAY AT [facility]:   Refer to the procedure report that was given to you for any specific questions about what was found during the examination.  If the procedure report does not answer your questions, please call your gastroenterologist to clarify.  If you requested that your care partner not be given the details of your procedure findings, then the procedure report has been included in a sealed envelope for you to review at your convenience later.  YOU SHOULD EXPECT: Some feelings of bloating in the abdomen. Passage of more gas than usual.  Walking can help get rid of the air that was put into your GI tract during the procedure and reduce the bloating. If you had a lower endoscopy (such as a colonoscopy or flexible sigmoidoscopy) you may notice spotting of blood in your stool or on the toilet paper. If you underwent a bowel prep for your procedure, you may not have a normal bowel movement for a few days.  Please Note:  You might notice some irritation and congestion in your nose or some drainage.  This is from the oxygen used during your procedure.  There is no need for concern and it should clear up in a day or so.  SYMPTOMS TO REPORT IMMEDIATELY:   Following lower endoscopy (colonoscopy or flexible sigmoidoscopy):  Excessive amounts of blood in the stool  Significant tenderness or worsening of abdominal pains  Swelling of the abdomen that is new, acute  Fever of 100F or higher   For urgent or emergent issues, a gastroenterologist can be reached at any hour by calling [phone].   DIET:  We do recommend a small meal at first, but then you may proceed to your regular diet.  Drink plenty of fluids but you should avoid alcoholic beverages for 24 hours. Try to increase the fiber in your diet, and drink plenty of water.  ACTIVITY:  You should plan to take it easy for the rest of today and you should NOT DRIVE or use heavy machinery until  tomorrow (because of the sedation medicines used during the test).    FOLLOW UP: Our staff will call the number listed on your records the next business day following your procedure to check on you and address any questions or concerns that you may have regarding the information given to you following your procedure. If we do not reach you, we will leave a message.  However, if you are feeling well and you are not experiencing any problems, there is no need to return our call.  We will assume that you have returned to your regular daily activities without incident.  If any biopsies were taken you will be contacted by phone or by letter within the next 1-3 weeks.  Please call us at [phone] if you have not heard about the biopsies in 3 weeks.    SIGNATURES/CONFIDENTIALITY: You and/or your care partner have signed paperwork which will be entered into your electronic medical record.  These signatures attest to the fact that that the information above on your After Visit Summary has been reviewed and is understood.  Full responsibility of the confidentiality of this discharge information lies with you and/or your care-partner.  Read all of the handouts ts given to you by your recovery room nurse.

## 2016-12-30 NOTE — Progress Notes (Signed)
Called to room to assist during endoscopic procedure.  Patient ID and intended procedure confirmed with present staff. Received instructions for my participation in the procedure from the performing physician.  

## 2016-12-30 NOTE — Progress Notes (Signed)
No changes in history since pre visit.

## 2017-01-02 ENCOUNTER — Telehealth: Payer: Self-pay | Admitting: *Deleted

## 2017-01-02 NOTE — Telephone Encounter (Signed)
  Follow up Call-  Call back number 12/30/2016  Post procedure Call Back phone  # [phone]  Permission to leave phone message Yes  Some recent data might be hidden     Patient questions:  Do you have a fever, pain , or abdominal swelling? No. Pain Score  0 *  Have you tolerated food without any problems? Yes.    Have you been able to return to your normal activities? Yes.    Do you have any questions about your discharge instructions: Diet   No. Medications  No. Follow up visit  No.  Do you have questions or concerns about your Care? No.  Actions: * If pain score is 4 or above: No action needed, pain <4.

## 2017-01-07 ENCOUNTER — Encounter: Payer: Self-pay | Admitting: Gastroenterology

## 2017-01-11 ENCOUNTER — Encounter: Payer: Self-pay | Admitting: Family Medicine

## 2017-01-11 DIAGNOSIS — M5432 Sciatica, left side: Secondary | ICD-10-CM | POA: Diagnosis not present

## 2017-01-11 DIAGNOSIS — M4726 Other spondylosis with radiculopathy, lumbar region: Secondary | ICD-10-CM | POA: Diagnosis not present

## 2017-01-11 DIAGNOSIS — M5136 Other intervertebral disc degeneration, lumbar region: Secondary | ICD-10-CM | POA: Diagnosis not present

## 2017-01-11 DIAGNOSIS — K635 Polyp of colon: Secondary | ICD-10-CM | POA: Insufficient documentation

## 2017-01-12 ENCOUNTER — Encounter: Payer: BLUE CROSS/BLUE SHIELD | Admitting: Gastroenterology

## 2017-03-06 ENCOUNTER — Ambulatory Visit: Payer: BLUE CROSS/BLUE SHIELD | Admitting: Family Medicine

## 2017-06-19 ENCOUNTER — Other Ambulatory Visit: Payer: Self-pay | Admitting: *Deleted

## 2017-06-19 DIAGNOSIS — K219 Gastro-esophageal reflux disease without esophagitis: Secondary | ICD-10-CM

## 2017-06-19 MED ORDER — OMEPRAZOLE 40 MG PO CPDR
40.0000 mg | DELAYED_RELEASE_CAPSULE | Freq: Every day | ORAL | 0 refills | Status: DC
Start: 2017-06-19 — End: 2017-09-18

## 2017-09-18 ENCOUNTER — Other Ambulatory Visit: Payer: Self-pay

## 2017-09-18 DIAGNOSIS — K219 Gastro-esophageal reflux disease without esophagitis: Secondary | ICD-10-CM

## 2017-09-18 MED ORDER — OMEPRAZOLE 40 MG PO CPDR: 40.0000 mg | DELAYED_RELEASE_CAPSULE | Freq: Every day | ORAL | 0 refills | Status: DC

## 2017-09-20 ENCOUNTER — Telehealth: Payer: Self-pay | Admitting: Family Medicine

## 2017-09-20 ENCOUNTER — Other Ambulatory Visit: Payer: Self-pay

## 2017-09-20 DIAGNOSIS — E785 Hyperlipidemia, unspecified: Secondary | ICD-10-CM

## 2017-09-20 MED ORDER — ATORVASTATIN CALCIUM 40 MG PO TABS: 40.0000 mg | ORAL_TABLET | Freq: Every day | ORAL | 0 refills | Status: DC

## 2017-09-20 NOTE — Telephone Encounter (Signed)
Copied from [place] [id number]. Topic: Quick Communication - Rx Refill/Question >> Sep 20, 2017 11:24 AM Oliver Pila B wrote: Medication: omeprazole (PRILOSEC) 40 MG capsule [[id number]]   Has the patient contacted their pharmacy? Yes.   (Agent: If no, request that the patient contact the pharmacy for the refill.) (Agent: If yes, when and what did the pharmacy advise?)  Preferred Pharmacy (with phone number or street name): [facility]  Agent: Please be advised that RX refills may take up to 3 business days. We ask that you follow-up with your pharmacy.

## 2017-11-10 DIAGNOSIS — M5412 Radiculopathy, cervical region: Secondary | ICD-10-CM | POA: Diagnosis not present

## 2017-11-10 DIAGNOSIS — M47892 Other spondylosis, cervical region: Secondary | ICD-10-CM | POA: Diagnosis not present

## 2018-01-10 DIAGNOSIS — M9901 Segmental and somatic dysfunction of cervical region: Secondary | ICD-10-CM | POA: Diagnosis not present

## 2018-01-11 DIAGNOSIS — M9901 Segmental and somatic dysfunction of cervical region: Secondary | ICD-10-CM | POA: Diagnosis not present

## 2018-01-11 DIAGNOSIS — M5412 Radiculopathy, cervical region: Secondary | ICD-10-CM | POA: Diagnosis not present

## 2018-01-15 DIAGNOSIS — M9901 Segmental and somatic dysfunction of cervical region: Secondary | ICD-10-CM | POA: Diagnosis not present

## 2018-01-15 DIAGNOSIS — M5412 Radiculopathy, cervical region: Secondary | ICD-10-CM | POA: Diagnosis not present

## 2018-01-17 ENCOUNTER — Encounter: Payer: Self-pay | Admitting: Family Medicine

## 2018-01-17 ENCOUNTER — Telehealth: Payer: Self-pay | Admitting: Family Medicine

## 2018-01-17 ENCOUNTER — Ambulatory Visit (INDEPENDENT_AMBULATORY_CARE_PROVIDER_SITE_OTHER): Payer: BLUE CROSS/BLUE SHIELD | Admitting: Family Medicine

## 2018-01-17 VITALS — BP 128/78 | HR 69 | Temp 98.0°F | Resp 16 | Ht 73.0 in | Wt 216.0 lb

## 2018-01-17 DIAGNOSIS — M5412 Radiculopathy, cervical region: Secondary | ICD-10-CM | POA: Diagnosis not present

## 2018-01-17 DIAGNOSIS — E663 Overweight: Secondary | ICD-10-CM | POA: Diagnosis not present

## 2018-01-17 DIAGNOSIS — E785 Hyperlipidemia, unspecified: Secondary | ICD-10-CM

## 2018-01-17 DIAGNOSIS — Z5181 Encounter for therapeutic drug level monitoring: Secondary | ICD-10-CM | POA: Diagnosis not present

## 2018-01-17 DIAGNOSIS — Z0000 Encounter for general adult medical examination without abnormal findings: Secondary | ICD-10-CM

## 2018-01-17 DIAGNOSIS — M9901 Segmental and somatic dysfunction of cervical region: Secondary | ICD-10-CM | POA: Diagnosis not present

## 2018-01-17 DIAGNOSIS — K219 Gastro-esophageal reflux disease without esophagitis: Secondary | ICD-10-CM | POA: Diagnosis not present

## 2018-01-17 DIAGNOSIS — K635 Polyp of colon: Secondary | ICD-10-CM

## 2018-01-17 DIAGNOSIS — R972 Elevated prostate specific antigen [PSA]: Secondary | ICD-10-CM

## 2018-01-17 DIAGNOSIS — Z23 Encounter for immunization: Secondary | ICD-10-CM

## 2018-01-17 DIAGNOSIS — G43009 Migraine without aura, not intractable, without status migrainosus: Secondary | ICD-10-CM

## 2018-01-17 DIAGNOSIS — Z125 Encounter for screening for malignant neoplasm of prostate: Secondary | ICD-10-CM

## 2018-01-17 DIAGNOSIS — Z131 Encounter for screening for diabetes mellitus: Secondary | ICD-10-CM

## 2018-01-17 DIAGNOSIS — Z130 Encounter for screening for diseases of the blood and blood-forming organs and certain disorders involving the immune mechanism: Secondary | ICD-10-CM | POA: Diagnosis not present

## 2018-01-17 DIAGNOSIS — Z79899 Other long term (current) drug therapy: Secondary | ICD-10-CM

## 2018-01-17 DIAGNOSIS — E559 Vitamin D deficiency, unspecified: Secondary | ICD-10-CM | POA: Diagnosis not present

## 2018-01-17 LAB — COMPREHENSIVE METABOLIC PANEL
ALT: 19 U/L (ref 0–53)
AST: 19 U/L (ref 0–37)
Albumin: 4.6 g/dL (ref 3.5–5.2)
Alkaline Phosphatase: 51 U/L (ref 39–117)
BUN: 17 mg/dL (ref 6–23)
CO2: 26 meq/L (ref 19–32)
Calcium: 9.7 mg/dL (ref 8.4–10.5)
Chloride: 105 mEq/L (ref 96–112)
Creatinine, Ser: 1.01 mg/dL (ref 0.40–1.50)
GFR: 82.62 mL/min (ref >60.00)
Glucose, Bld: 149 mg/dL — ABNORMAL HIGH (ref 70–99)
POTASSIUM: 3.8 meq/L (ref 3.5–5.1)
Sodium: 140 mEq/L (ref 135–145)
Total Bilirubin: 0.8 mg/dL (ref 0.2–1.2)
Total Protein: 6.6 g/dL (ref 6.0–8.3)

## 2018-01-17 LAB — CBC WITH DIFFERENTIAL/PLATELET
BASOS ABS: 0 10*3/uL (ref 0.0–0.1)
Basophils Relative: 0.4 % (ref 0.0–3.0)
EOS ABS: 0.2 10*3/uL (ref 0.0–0.7)
EOS PCT: 2.6 % (ref 0.0–5.0)
HCT: 44.4 % (ref 39.0–52.0)
Hemoglobin: 15.2 g/dL (ref 13.0–17.0)
LYMPHS ABS: 3 10*3/uL (ref 0.7–4.0)
Lymphocytes Relative: 42.5 % (ref 12.0–46.0)
MCHC: 34.3 g/dL (ref 30.0–36.0)
MCV: 94.7 fl (ref 78.0–100.0)
MONO ABS: 0.5 10*3/uL (ref 0.1–1.0)
Monocytes Relative: 7.6 % (ref 3.0–12.0)
NEUTROS PCT: 46.9 % (ref 43.0–77.0)
Neutro Abs: 3.3 10*3/uL (ref 1.4–7.7)
Platelets: 286 10*3/uL (ref 150.0–400.0)
RBC: 4.69 Mil/uL (ref 4.22–5.81)
RDW: 12.8 % (ref 11.5–15.5)
WBC: 7.1 10*3/uL (ref 4.0–10.5)

## 2018-01-17 LAB — PSA: PSA: 1.97 ng/mL (ref 0.10–4.00)

## 2018-01-17 LAB — LIPID PANEL
Cholesterol: 197 mg/dL (ref 0–200)
HDL: 40.4 mg/dL (ref >39.00)
NonHDL: 156.77
Total CHOL/HDL Ratio: 5
Triglycerides: 336 mg/dL — ABNORMAL HIGH (ref 0.0–149.0)
VLDL: 67.2 mg/dL — ABNORMAL HIGH (ref 0.0–40.0)

## 2018-01-17 LAB — LDL CHOLESTEROL, DIRECT: LDL DIRECT: 114 mg/dL

## 2018-01-17 LAB — TSH: TSH: 1.49 u[IU]/mL (ref 0.35–4.50)

## 2018-01-17 LAB — VITAMIN B12: VITAMIN B 12: 520 pg/mL (ref 211–911)

## 2018-01-17 LAB — MAGNESIUM: Magnesium: 2.1 mg/dL (ref 1.5–2.5)

## 2018-01-17 LAB — VITAMIN D 25 HYDROXY (VIT D DEFICIENCY, FRACTURES): VITD: 30.25 ng/mL (ref 30.00–100.00)

## 2018-01-17 LAB — HEMOGLOBIN A1C: HEMOGLOBIN A1C: 5.4 % (ref 4.6–6.5)

## 2018-01-17 MED ORDER — SUMATRIPTAN SUCCINATE 100 MG PO TABS: 100.0000 mg | ORAL_TABLET | Freq: Once | ORAL | 5 refills | Status: DC

## 2018-01-17 MED ORDER — OMEPRAZOLE 40 MG PO CPDR: 40.0000 mg | DELAYED_RELEASE_CAPSULE | Freq: Every day | ORAL | 3 refills | Status: DC

## 2018-01-17 MED ORDER — ATORVASTATIN CALCIUM 80 MG PO TABS: 80.0000 mg | ORAL_TABLET | Freq: Every day | ORAL | 3 refills | Status: DC

## 2018-01-17 NOTE — Progress Notes (Signed)
Patient ID: Warren Hall., male  DOB: 12/18/1965, 52 y.o.   MRN: [id number] Patient Care Team    Relationship Specialty Notifications Start End  Warren Hillock, DO PCP - General Family Medicine  09/29/14   Warren Kief, MD  Rehabilitation  12/09/16   Warren Flock, MD Consulting Physician Gastroenterology  01/17/18     Chief Complaint  Patient presents with  . Annual Exam    Pt is not fasting. Rf on Ompriozole.    Subjective:  Warren Hall. is a 52 y.o. male present for CPE. All past medical history, surgical history, allergies, family history, immunizations, medications and social history were updated in the electronic medical record today. All recent labs, ED visits and hospitalizations within the last year were reviewed.  His Migraines are about the same. 2-4 x a month, responds well to Imitrex. He is taking his B complex daily.  He has chronic GERD, take omeprazole 40 mg daily.   Hyperlipidemia: Patient reports he is taking Lipitor 40 mg QD, tolerating well- did not follow up at 3 mos for retesting.  He typically exercises rather routinely, but he has been suffering from his lower back issues and has not been exercising as much is normal.   Health maintenance: Updated 01/17/18 Colonoscopy: No FHX, completed 12/30/2017, Warren Hall, polyp present- 5 year recall.   Immunizations: flu shot completed today and td UTD 2015 Infectious disease screening: HIV completed 2015 PSA: no urinary changes- did not follow up on abnormal last year Lab Results  Component Value Date   PSA 4.13 (H) 12/08/2016   PSA 0.79 12/04/2014   PSA 0.93 11/21/2013  , pt was counseled on prostate cancer screenings.  Assistive device: none Oxygen RCB:ULAG Patient has a Dental home. Hospitalizations/ED visits: reviewed  Depression screen [facility] 2/9 01/17/2018 12/08/2016 12/07/2015  Decreased Interest 0 0 0  Down, Depressed, Hopeless 0 0 0  PHQ - 2 Score 0 0 0   No flowsheet data  found.   Fall Risk  12/07/2015  Falls in the past year? No    Immunization History  Administered Date(s) Administered  . Influenza,inj,Quad PF,6+ Mos 11/27/2013, 10/21/2014, 12/07/2015, 12/08/2016, 01/17/2018  . Tdap 11/27/2013   Past Medical History:  Diagnosis Date  . Allergy    Seasonal  . Frequent headaches   . Hyperlipidemia   . Lumbar herniated disc 2001   conservative treatment  . Migraines   . Seasonal asthma    No Known Allergies Past Surgical History:  Procedure Laterality Date  . ACHILLES TENDON REPAIR    . LASIK     Family History  Problem Relation Age of Onset  . COPD Mother   . Hypertension Father   . Colon polyps Father   . COPD Maternal Grandmother   . Multiple sclerosis Maternal Grandfather   . Cancer Paternal Grandfather        lung  . Colon cancer Neg Hx   . Esophageal cancer Neg Hx   . Rectal cancer Neg Hx   . Stomach cancer Neg Hx    Social History   Socioeconomic History  . Marital status: Married    Spouse name: Not on file  . Number of children: 2  . Years of education: Bachelor D  . Highest education level: Not on file  Occupational History  . Occupation: Scientist, clinical (histocompatibility and immunogenetics): [employer]    Employer: [employer]  Social Needs  . Financial resource strain: Not  on file  . Food insecurity:    Worry: Not on file    Inability: Not on file  . Transportation needs:    Medical: Not on file    Non-medical: Not on file  Tobacco Use  . Smoking status: Former Research scientist (life sciences)  . Smokeless tobacco: Never Used  . Tobacco comment: on and off 30 years ago  Substance and Sexual Activity  . Alcohol use: Yes    Comment: weekly  . Drug use: No  . Sexual activity: Yes  Lifestyle  . Physical activity:    Days per week: Not on file    Minutes per session: Not on file  . Stress: Not on file  Relationships  . Social connections:    Talks on phone: Not on file    Gets together: Not on file    Attends religious service: Not on  file    Active member of club or organization: Not on file    Attends meetings of clubs or organizations: Not on file    Relationship status: Not on file  . Intimate partner violence:    Fear of current or ex partner: Not on file    Emotionally abused: Not on file    Physically abused: Not on file    Forced sexual activity: Not on file  Other Topics Concern  . Not on file  Social History Narrative   Warren Hall lives with his daughter. He works Medical laboratory scientific officer.   Allergies as of 01/17/2018   No Known Allergies     Medication List       Accurate as of January 17, 2018  1:29 PM. Always use your most recent med list.        albuterol 108 (90 Base) MCG/ACT inhaler Commonly known as:  PROVENTIL HFA;VENTOLIN HFA Inhale 2 puffs into the lungs every 4 (four) hours as needed for wheezing.   atorvastatin 40 MG tablet Commonly known as:  LIPITOR Take 1 tablet (40 mg total) by mouth daily.   gabapentin 300 MG capsule Commonly known as:  NEURONTIN Take 300 mg by mouth 3 (three) times daily. 1 T am, 2 T afternoon, 1 T pm   omeprazole 40 MG capsule Commonly known as:  PRILOSEC Take 1 capsule (40 mg total) by mouth daily.   SUMAtriptan 100 MG tablet Commonly known as:  IMITREX Take 1 tablet (100 mg total) by mouth once for 1 dose. May repeat in 2 hours if headache persists or recurs.      All past medical history, surgical history, allergies, family history, immunizations andmedications were updated in the EMR today and reviewed under the history and medication portions of their EMR.     No results found for this or any previous visit (from the past 2160 hour(s)).   ROS: 14 pt review of systems performed and negative (unless mentioned in an HPI)  Objective: BP 128/78 (BP Location: Left Arm, Patient Position: Sitting, Cuff Size: Large)   Pulse 69   Temp 98 F (36.7 C) (Oral)   Resp 16   Ht '6\' 1"'  (1.854 m)   Wt 216 lb (98 kg)   SpO2 97%   BMI 28.50 kg/m  Gen: Afebrile. No acute  distress. Nontoxic in appearance, well-developed, well-nourished,  Pleasant overweight Caucasian male.  HENT: AT. [place]. Bilateral TM visualized and normal in appearance, normal external auditory canal. MMM, no oral lesions, adequate dentition. Bilateral nares within normal limits. Throat without erythema, ulcerations or exudates. no Cough on exam, no hoarseness on  exam. Eyes:Pupils Equal Round Reactive to light, Extraocular movements intact,  Conjunctiva without redness, discharge or icterus. Neck/lymp/endocrine: Supple,no lymphadenopathy, no thyromegaly CV: RRR no murmur, no edema, +2/4 P posterior tibialis pulses. no carotid bruits. No JVD. Chest: CTAB, no wheeze, rhonchi or crackles. normal Respiratory effort. good Air movement. Abd: Soft. flat. NTND. BS present. no Masses palpated. No hepatosplenomegaly. No rebound tenderness or guarding. Skin: no rashes, purpura or petechiae. Warm and well-perfused. Skin intact. Neuro/Msk:  Normal gait. PERLA. EOMi. Alert. Oriented x3.  Cranial nerves II through XII intact. Muscle strength 5/5 upper/lower extremity. DTRs equal bilaterally. Psych: Normal affect, dress and demeanor. Normal speech. Normal thought content and judgment.  No exam data present  Assessment/plan: Warren Hall. is a 52 y.o. male present for CPE Migraine without aura and without status migrainosus, not intractable Stable- continue b12- consider mag supplement.  - SUMAtriptan (IMITREX) 100 MG tablet; Take 1 tablet (100 mg total) by mouth once for 1 dose. May repeat in 2 hours if headache persists or recurs.  Dispense: 15 tablet; Refill: 5 Gastroesophageal reflux disease, esophagitis presence not specified - try decreasing to QOD if able.  - omeprazole (PRILOSEC) 40 MG capsule; Take 1 capsule (40 mg total) by mouth daily.  Dispense: 90 capsule; Refill: 3 Overweight (BMI 25.0-29.9) Diet and exercise recs.  Hyperlipidemia, unspecified hyperlipidemia type - tolerating Lipitor 40-->  refill/adjust after labs.  - Lipid Profile - Comp Met (CMET) - TSH Vitamin D deficiency - Vitamin D (25 hydroxy) Immunization due - Flu Vaccine QUAD 6+ mos PF IM (Fluarix Quad PF) Screening for deficiency anemia - CBC w/Diff Diabetes mellitus screening - HgB A1c Elevated PSA - mildly above normal last year. He forgot to follow up at 3 months for retest - PSA Encounter for monitoring long-term proton pump inhibitor therapy - Magnesium - B12 - Vitamin D (25 hydroxy) Encounter for preventive health examination Patient was encouraged to exercise greater than 150 minutes a week. Patient was encouraged to choose a diet filled with fresh fruits and vegetables, and lean meats. AVS provided to patient today for education/recommendation on gender specific health and safety maintenance. Colonoscopy: No FHX, completed 12/30/2017, Warren Hall, polyp present- 5 year recall.   Immunizations: flu shot completed today and td UTD 2015 Infectious disease screening: HIV completed 2015 PSA: no urinary changes- did not follow up on abnormal last year-collected today   Return in about 1 year (around 01/18/2019) for CPE.  Note is dictated utilizing voice recognition software. Although note has been proof read prior to signing, occasional typographical errors still can be missed. If any questions arise, please do not hesitate to call for verification.  Electronically signed by: Howard Pouch, DO [place]

## 2018-01-17 NOTE — Patient Instructions (Signed)

## 2018-01-17 NOTE — Telephone Encounter (Signed)
Please inform patient the following information: His labs overall looked really good.   - His prostate screening is back to normal. -Vitamin D is in the low normal range .  B12 and magnesium are in the normal range. -His cholesterol is greatly improved total cholesterol went from 319--> 197.  His LDL still is not below 100 and his triglycerides are still elevated, therefore I have called in the next dose higher of his cholesterol medication.  Follow-up in 1 year for his physical, unless needed to be seen sooner.

## 2018-01-18 NOTE — Telephone Encounter (Signed)
Detailed message left on voice mail. Okay per DPR. 

## 2018-07-24 ENCOUNTER — Encounter: Payer: Self-pay | Admitting: Family Medicine

## 2018-07-24 ENCOUNTER — Other Ambulatory Visit: Payer: Self-pay

## 2018-07-24 ENCOUNTER — Ambulatory Visit (INDEPENDENT_AMBULATORY_CARE_PROVIDER_SITE_OTHER): Payer: BC Managed Care – PPO | Admitting: Family Medicine

## 2018-07-24 VITALS — BP 128/77 | HR 65 | Temp 98.6°F | Resp 17 | Ht 73.0 in | Wt 207.1 lb

## 2018-07-24 DIAGNOSIS — T148XXA Other injury of unspecified body region, initial encounter: Secondary | ICD-10-CM | POA: Diagnosis not present

## 2018-07-24 DIAGNOSIS — M546 Pain in thoracic spine: Secondary | ICD-10-CM

## 2018-07-24 MED ORDER — METHYLPREDNISOLONE ACETATE 80 MG/ML IJ SUSP
80.0000 mg | Freq: Once | INTRAMUSCULAR | Status: AC
  Administered 2018-07-24: 16:00:00 80 mg via INTRAMUSCULAR

## 2018-07-24 MED ORDER — CYCLOBENZAPRINE HCL 10 MG PO TABS: 10.0000 mg | ORAL_TABLET | Freq: Three times a day (TID) | ORAL | 0 refills | Status: DC | PRN

## 2018-07-24 MED ORDER — NAPROXEN 500 MG PO TABS: 500.0000 mg | ORAL_TABLET | Freq: Two times a day (BID) | ORAL | 0 refills | Status: DC

## 2018-07-24 NOTE — Progress Notes (Signed)
Unknown Foley , [birth year], 53 y.o., male MRN: [id number] Patient Care Team    Relationship Specialty Notifications Start End  [name], DO PCP - General Family Medicine  09/29/14   Zonia Kief, MD  Rehabilitation  12/09/16   Yetta Flock, MD Consulting Physician Gastroenterology  01/17/18     Chief Complaint  Patient presents with  . Back Pain    Lower right sided back pain that was coming and going and now is more constant x2 weeks. Denies trauma, falls, or heavy lifting.      Subjective: Warren Hall. is a 53 y.o. male Pt presents for an OV with complaints of right lower back pain of 1 month duration.  Associated symptoms include worsening pain over the last 2 weeks.  Has a history lumbar herniated disc in 2001 with conservative treatment only.  Patient reports he has increased his workout lately and started to add pull-ups, he does not know if this has any association with the development of his condition.  He feels that his pain is on the lower right thoracic area below his scapula near his ribs.  He feels the pain is worse when he is coming up from flexing his back.  He states sometimes it will feel like it sticks when he stands straight.  It can feel uncomfortable when he takes a deep breath.  The pain was intermittent and now is constant, at least at a low-grade discomfort.  It occasionally will cause a sharp pain.  He states he still playing tennis and that does not seem to bother it much.  Heating helps with comfort.  He denies any recent illness or URI.  He states he had noticed he is needed his albuterol a little bit more frequently the last few days using it once a day.  He denies any fever, cough or shortness of breath.  He reports before needing albuterol last couple days, he rarely used his albuterol throughout the year..   Depression screen [facility] 2/9 01/17/2018 12/08/2016 12/07/2015  Decreased Interest 0 0 0  Down, Depressed, Hopeless 0 0 0  PHQ - 2  Score 0 0 0    No Known Allergies Social History   Social History Narrative   Mr Warren Hall lives with his daughter. He works Medical laboratory scientific officer.   Past Medical History:  Diagnosis Date  . Allergy    Seasonal  . Frequent headaches   . Hyperlipidemia   . Lumbar herniated disc 2001   conservative treatment  . Migraines   . Seasonal asthma    Past Surgical History:  Procedure Laterality Date  . ACHILLES TENDON REPAIR    . LASIK     Family History  Problem Relation Age of Onset  . COPD Mother   . Hypertension Father   . Colon polyps Father   . COPD Maternal Grandmother   . Multiple sclerosis Maternal Grandfather   . Cancer Paternal Grandfather        lung  . Colon cancer Neg Hx   . Esophageal cancer Neg Hx   . Rectal cancer Neg Hx   . Stomach cancer Neg Hx    Allergies as of 07/24/2018   No Known Allergies     Medication List       Accurate as of July 24, 2018  3:46 PM. If you have any questions, ask your nurse or doctor.        albuterol 108 (90 Base) MCG/ACT inhaler Commonly known  as: VENTOLIN HFA Inhale 2 puffs into the lungs every 4 (four) hours as needed for wheezing.   atorvastatin 80 MG tablet Commonly known as: LIPITOR Take 1 tablet (80 mg total) by mouth daily.   gabapentin 300 MG capsule Commonly known as: NEURONTIN Take 300 mg by mouth 3 (three) times daily. 1 T am, 2 T afternoon, 1 T pm   Omega-3 1000 MG Caps Take 1,000 mg by mouth daily.   omeprazole 40 MG capsule Commonly known as: PRILOSEC Take 1 capsule (40 mg total) by mouth daily.   SUMAtriptan 100 MG tablet Commonly known as: Imitrex Take 1 tablet (100 mg total) by mouth once for 1 dose. May repeat in 2 hours if headache persists or recurs.       All past medical history, surgical history, allergies, family history, immunizations andmedications were updated in the EMR today and reviewed under the history and medication portions of their EMR.     ROS: Negative, with the exception of above  mentioned in HPI   Objective:  BP 128/77 (BP Location: Left Arm, Patient Position: Sitting, Cuff Size: Normal)   Pulse 65   Temp 98.6 F (37 C) (Temporal)   Resp 17   Ht 6\' 1"  (1.854 m)   Wt 207 lb 2 oz (94 kg)   SpO2 97%   BMI 27.33 kg/m  Body mass index is 27.33 kg/m. Gen: Afebrile. No acute distress. Nontoxic in appearance, well developed, well nourished.  CV: RRR no murmur, no edema Chest: CTAB, no wheeze or crackles. Good air movement, normal resp effort.  MSK: No erythema, no soft tissue swelling.  Thoracic paraspinal muscle with tenderness to palpation in tissue texture change right thoracic/rib level T9-12.  Full range of motion lumbar and thoracic spine with discomfort right lower thoracic region  with returning from flexion.  Neurovascularly intact distally. Skin: no rashes, purpura or petechiae.  Neuro:  Normal gait. PERLA. EOMi. Alert. Oriented x3 No exam data present No results found. No results found for this or any previous visit (from the past 24 hour(s)).  Assessment/Plan: Gabryel Files. is a 53 y.o. male present for OV for  Acute right-sided thoracic back pain/Muscle strain -Discussed different options of approach for him today.  I do believe he has a muscle strain of his right thoracic area.  Possibly intercostal muscle strain vs rib somatic dysfunction versus rib head subluxation.  Exam is consistent with a muscle spasm in the area of discomfort, with reproduction of symptoms when palpated.   -IM Depo-Medrol injection provided today.  This should help with inflammatory reaction/strain and if he is having a mild allergy or asthma flare this should help resolve those issues as well.  Patient was encouraged to restart Flonase nasal spray and antihistamines for his wheezing/asthma.  And if worsening he needs to be seen to follow-up on this condition.  He reports understanding. -Addition to steroid injection, Flexeril nightly scheduled and if able to tolerate can use  twice daily as needed, naproxen twice daily with meals for pain and anti-inflammatory effect. - methylPREDNISolone acetate (DEPO-MEDROL) injection 80 mg -Patient to follow-up in 3-4 weeks if pain is not improved or resolving, at that point would consider x-ray.  He is agreeable to plan today.    Reviewed expectations re: course of current medical issues.  Discussed self-management of symptoms.  Outlined signs and symptoms indicating need for more acute intervention.  Patient verbalized understanding and all questions were answered.  Patient received an After-Visit  Summary.    No orders of the defined types were placed in this encounter.   > 25 minutes spent with patient, >50% of time spent face to face    Note is dictated utilizing voice recognition software. Although note has been proof read prior to signing, occasional typographical errors still can be missed. If any questions arise, please do not hesitate to call for verification.   electronically signed by:  Howard Pouch, DO  [place]

## 2018-07-24 NOTE — Patient Instructions (Addendum)
It was a pleasure seeing you today.  Steroid shot will help with muscle and lungs.  Naproxen every 12 hours with food for 7 days, then as needed for pain and anti-inflammatory.  Flexeril before bed, can use every 8 hours if needed and does not make you sleepy.   This seems like a muscle strain.  Heat and stretches recommended.  Follow up in 3-4 weeks if not improving.   Late Policy/No Shows:  - all new patients should arrive 15-30 minutes earlier than appointment to allow [place] time  to  obtain all personal demographics,  insurance information and for you to complete office paperwork. - All established patients should arrive 10-15 minutes earlier than appointment time to update all information and be checked in .  - In our best efforts to run on time, if you are late for your appointment you will be asked to either reschedule or if able, we will work you back into the schedule. There will be a wait time to work you back in the schedule,  depending on availability.  - If you are unable to make it to your appointment as scheduled, please call 24 hours ahead of time to allow [place] to fill the time slot with someone else who needs to be seen. If you do not cancel your appointment ahead of time, you may be charged a no show fee.

## 2018-07-25 ENCOUNTER — Encounter: Payer: Self-pay | Admitting: Family Medicine

## 2018-07-30 IMAGING — MR MR LUMBAR SPINE W/O CM
4 of 5 series · 18 of 48 positions shown · non-contrast
Comparison: Prior radiographs from 10/21/2014 as well as previous
MRI from 12/16/2013.

CLINICAL DATA: Initial evaluation for chronic low back pain with
pain and left foot and thigh for 6 months.

EXAM:
MRI LUMBAR SPINE WITHOUT CONTRAST
TECHNIQUE: Multiplanar, multisequence MR imaging of the lumbar spine was
performed. No intravenous contrast was administered.

[Series 6: T2 · sagittal · 4.0mm · 0.73mm/px · 6 of 15 slices shown (1 of 2)]
[im 1/15]
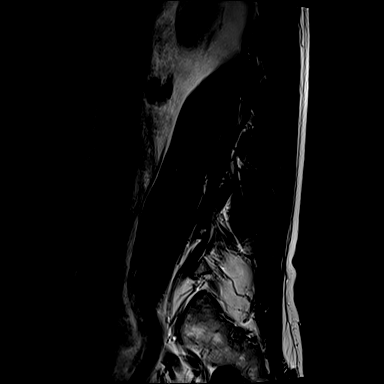
[im 3/15]
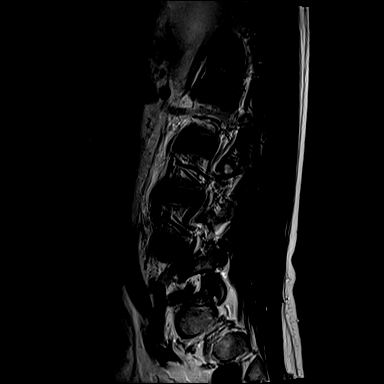
[im 6/15]
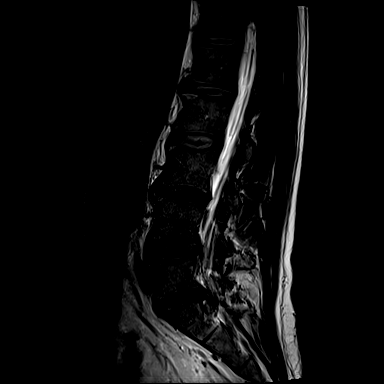
[im 9/15]
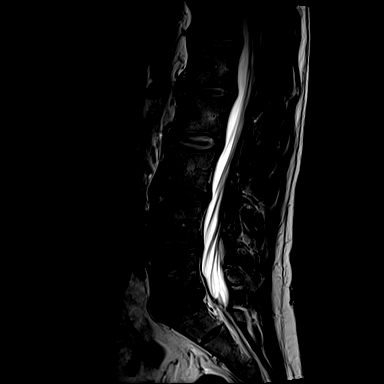
[im 12/15]
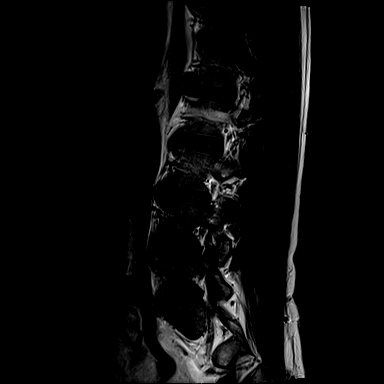
[im 15/15]
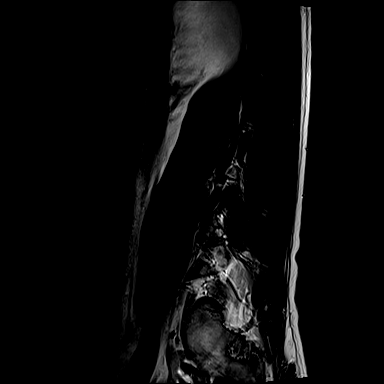

[Series 7: T1 · sagittal · 4.0mm · 0.73mm/px · 3 of 15 slices shown (1 of 2)]
[im 3/15]
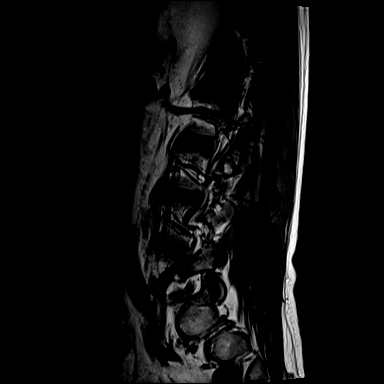
[im 9/15]
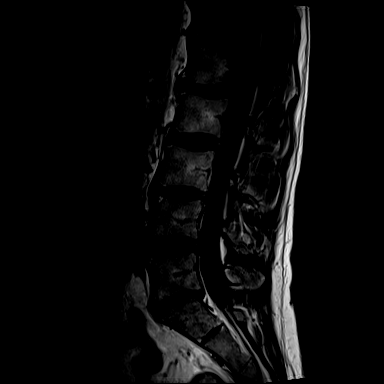
[im 15/15]
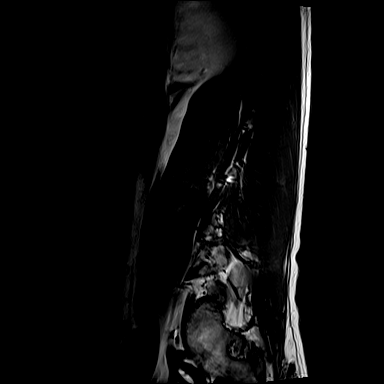

[Series 13: T2 · axial · 4.0mm · 0.28mm/px · z∈[-67,+94]mm · 6 of 36 slices shown (2 of 2)]
[im 1/36]
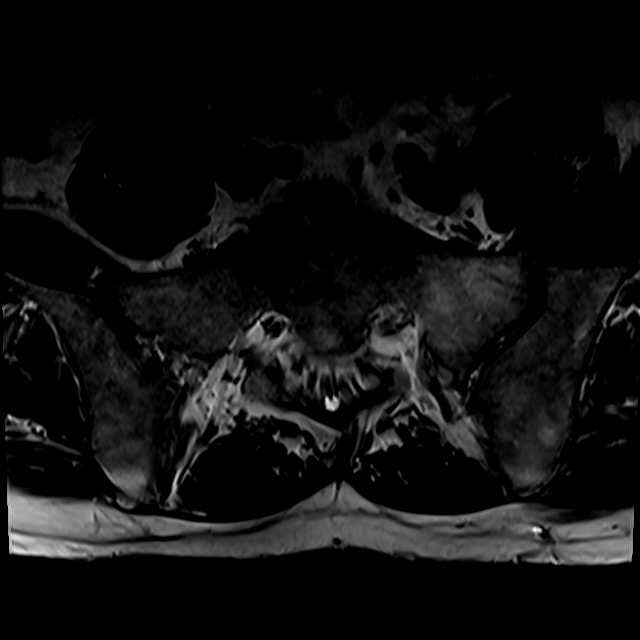
[im 6/36]
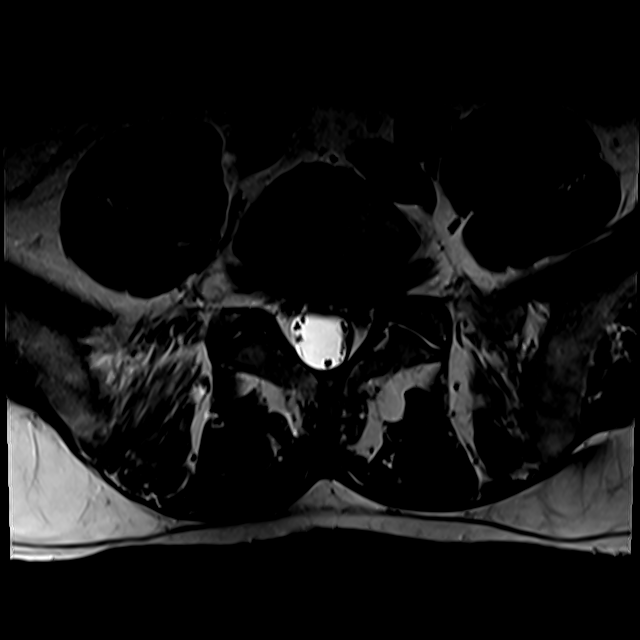
[im 11/36]
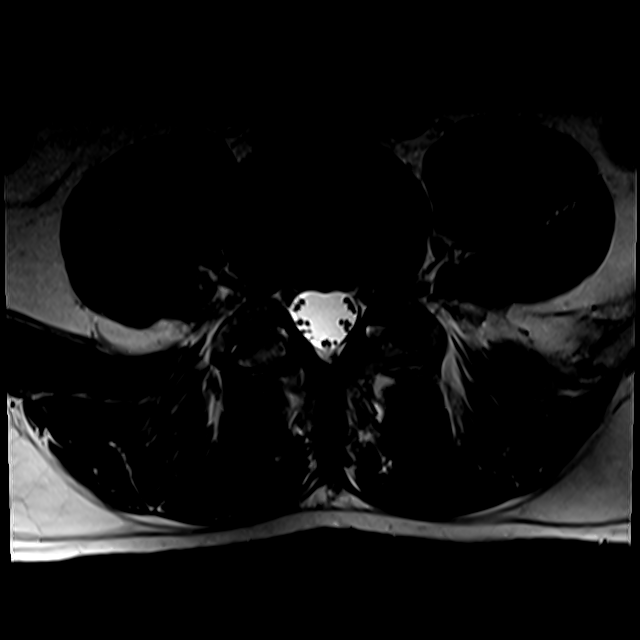
[im 16/36]
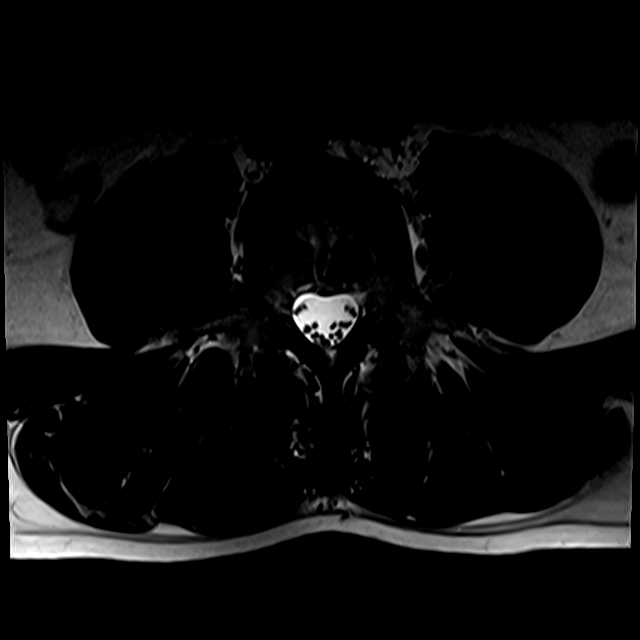
[im 18/36]
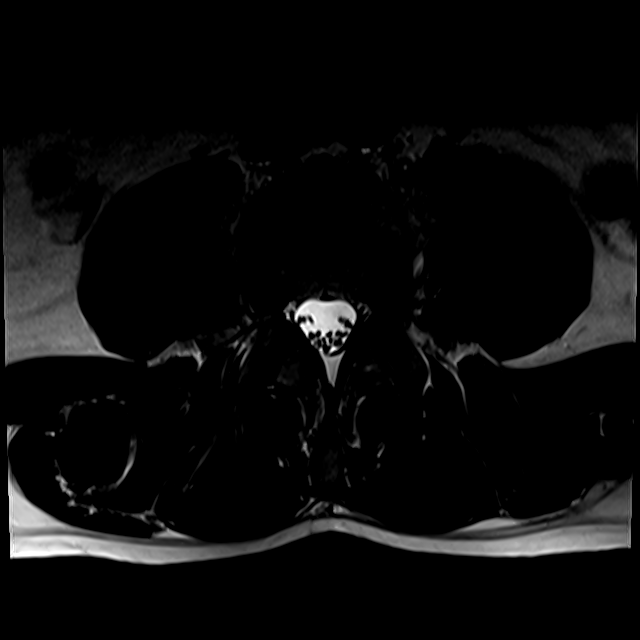
[im 31/36]
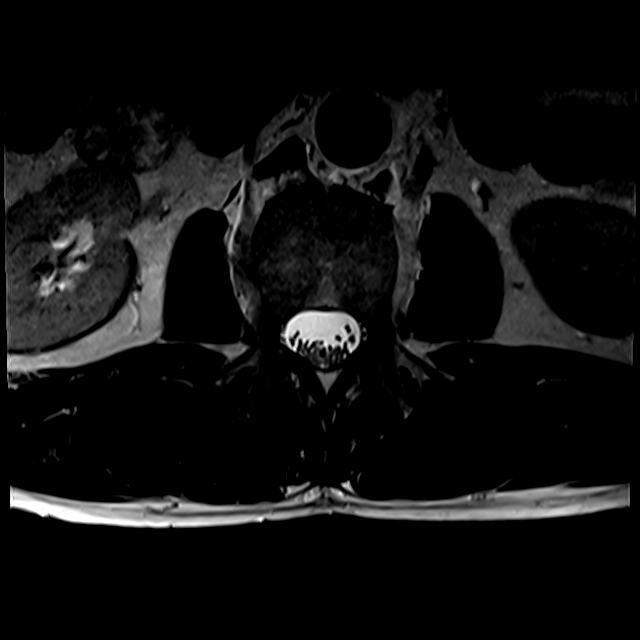

[Series 101: T1 · axial · 4.0mm · 0.28mm/px · z∈[-42,+94]mm · 3 of 36 slices shown (2 of 2)]
[im 6/36]
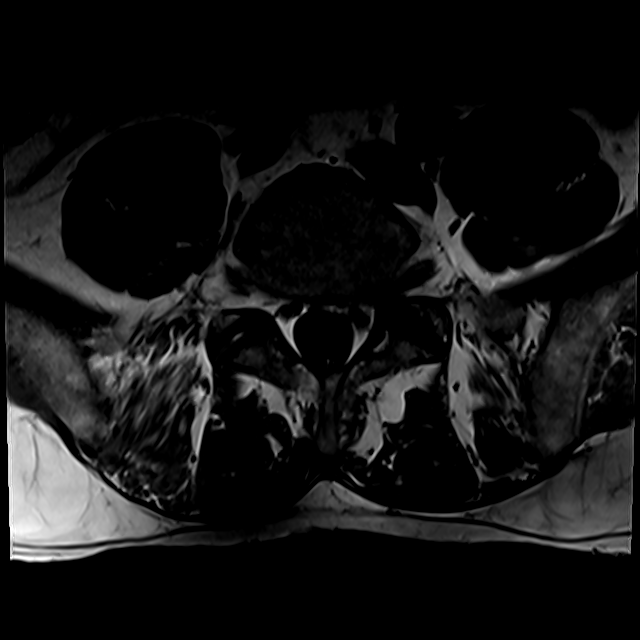
[im 18/36]
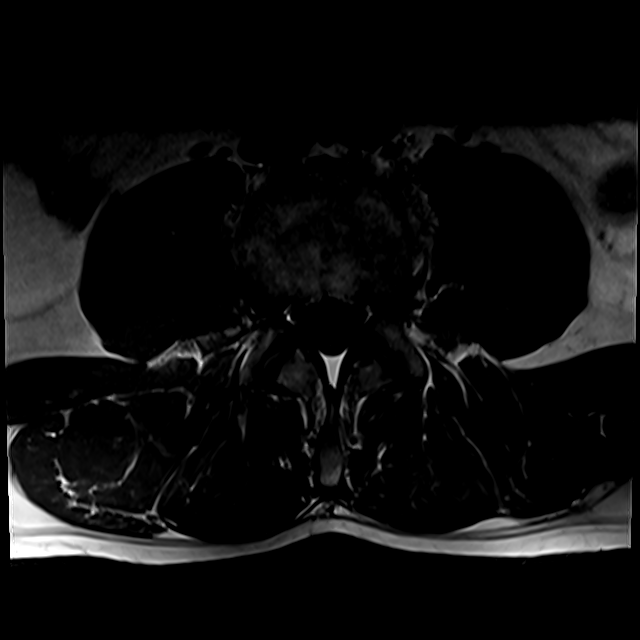
[im 31/36]
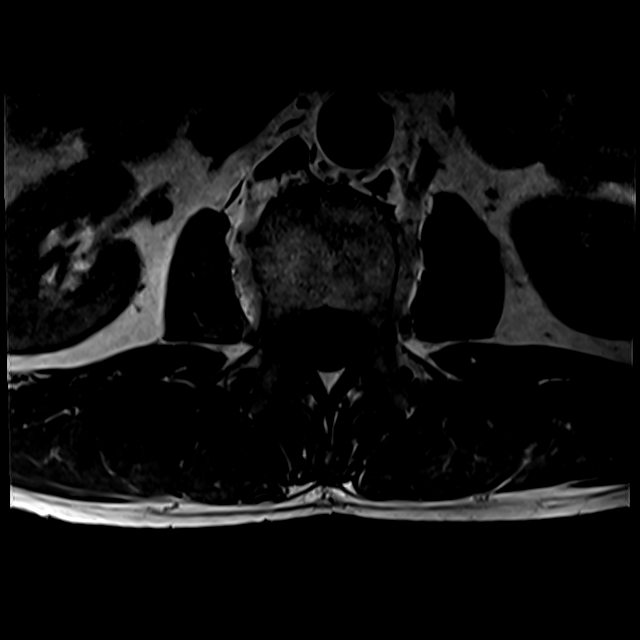

[18 of 48 positions shown; findings below may reference images not displayed]

FINDINGS: Segmentation: Normal segmentation. Lowest well-formed disc labeled
the L5-S1 level.

Alignment: Mild straightening of the normal lumbar lordosis, stable.
No listhesis.

Vertebrae: Vertebral body heights maintained. No evidence for acute
or chronic fracture. Bone marrow signal intensity within normal
limits. No discrete or worrisome osseous lesions. No abnormal marrow
edema.

Conus medullaris: Extends to the L1 level and appears normal.

Paraspinal and other soft tissues: Paraspinous soft tissues within
normal limits. Visualized visceral structures unremarkable.

Disc levels:

L1-2: Mild annular disc bulge with disc desiccation. No stenosis or
neural impingement.

L2-3:  Unremarkable.

L3-4: Diffuse disc bulge with disc desiccation and intervertebral
disc space narrowing. Mild ligamentum flavum hypertrophy. No canal
or neural foraminal stenosis. No impingement.

L4-5: Minimal annular disc bulge. Mild facet ligamentum flavum
hypertrophy. No stenosis or neural impingement.

L5-S1: Chronic intervertebral disc space narrowing with disc
desiccation. Again seen is a left subarticular disc protrusion,
extending laterally towards the left neural foramen (series 13,
image 32). There is a new/increased superimposed left subarticular
disc extrusion with inferior migration (Series 6, image 10 on
sagittal sequence, series 13, image 34 on axial sequence). Inferior
migration extends approximately 15 mm relative to the parent L5-S1
disc space Extruded disc material impinges upon the descending left
S1 nerve root which is slightly displaced posteriorly. Central canal
remains widely patent. No significant foraminal stenosis.
IMPRESSION: 1. Persistent left subarticular/foraminal disc protrusion at L5-S1,
with new/increased superimposed disc extrusion with inferior
migration, impinging upon the descending left S1 nerve root.
2. Otherwise stable appearance of the lumbar spine with relative
mild degenerative changes as above. No other significant stenosis or
impingement.

## 2018-07-31 ENCOUNTER — Encounter: Payer: Self-pay | Admitting: Family Medicine

## 2018-07-31 ENCOUNTER — Telehealth: Payer: Self-pay | Admitting: Family Medicine

## 2018-07-31 NOTE — Telephone Encounter (Signed)
I do not see where a x-ray was discussed with patient and was to F/U in 3-4 wks if not better. Would you like pt scheduled for F/U or order x-ray? Please advise.

## 2018-07-31 NOTE — Telephone Encounter (Signed)
Lost original thread. Yes, per my note he is to follow up and then we will decide on xray or further management.

## 2018-08-01 ENCOUNTER — Encounter: Payer: Self-pay | Admitting: Family Medicine

## 2018-08-01 ENCOUNTER — Other Ambulatory Visit: Payer: Self-pay

## 2018-08-01 ENCOUNTER — Ambulatory Visit (INDEPENDENT_AMBULATORY_CARE_PROVIDER_SITE_OTHER): Payer: BC Managed Care – PPO | Admitting: Family Medicine

## 2018-08-01 VITALS — Ht 73.0 in

## 2018-08-01 DIAGNOSIS — M546 Pain in thoracic spine: Secondary | ICD-10-CM | POA: Diagnosis not present

## 2018-08-01 DIAGNOSIS — Z7189 Other specified counseling: Secondary | ICD-10-CM | POA: Diagnosis not present

## 2018-08-01 DIAGNOSIS — R0781 Pleurodynia: Secondary | ICD-10-CM | POA: Diagnosis not present

## 2018-08-01 DIAGNOSIS — R0602 Shortness of breath: Secondary | ICD-10-CM | POA: Diagnosis not present

## 2018-08-01 NOTE — Progress Notes (Addendum)
VIRTUAL VISIT VIA VIDEO  I connected with Warren Hall. on 08/14/18 at  4:00 PM EDT by a video enabled telemedicine application and verified that I am speaking with the correct person using two identifiers. Location patient: Home Location provider: [facility], Office Persons participating in the virtual visit: Patient, Dr. Raoul Pitch and R.Baker, LPN  I discussed the limitations of evaluation and management by telemedicine and the availability of in person appointments. The patient expressed understanding and agreed to proceed.      Warren Hall , 1965/07/19, 53 y.o., male MRN: [id number] Patient Care Team    Relationship Specialty Notifications Start End  [name], DO PCP - General Family Medicine  09/29/14   Zonia Kief, MD  Rehabilitation  12/09/16   Yetta Flock, MD Consulting Physician Gastroenterology  01/17/18     Chief Complaint  Patient presents with   Back Pain    Pt continues to have back pain and having spells of SOB. Denies wheezing and feels like he cannot get a deep breath. Unable to obtain vitals     Subjective: Warren Hall. is a 53 y.o. male Pt presents for an OV to follow-up on his back pain. He reports his pain has continued since being seen 07/24/2018.  The pain is in the right lateral thoracic area over his ribs.  He reports the IM Depo-Medrol shot not seem to make any changes.  He reports the Flexeril seems to help some and relax his muscles down and help him sleep.  He reports when he moves he still is having some discomfort in that location.  He is still able to play tennis.  He reports he is getting more short of breath intermittently throughout the day.  He denies any wheezing, fever, chills, cough or fever.  He denies any upper respiratory symptoms.  He denies any radiation of pain to his arms or lower extremities.  He states sometimes he just feels like he cannot catch a full breath.  Yesterday this occurred for a  couple hours and then finally relaxed.  He has no known COVID-19 exposure.  He has been taking the allergy regimen.  Prior note:  complaints of right lower back pain of 1 month duration.  Associated symptoms include worsening pain over the last 2 weeks.  Has a history lumbar herniated disc in 2001 with conservative treatment only.  Patient reports he has increased his workout lately and started to add pull-ups, he does not know if this has any association with the development of his condition.  He feels that his pain is on the lower right thoracic area below his scapula near his ribs.  He feels the pain is worse when he is coming up from flexing his back.  He states sometimes it will feel like it sticks when he stands straight.  It can feel uncomfortable when he takes a deep breath.  The pain was intermittent and now is constant, at least at a low-grade discomfort.  It occasionally will cause a sharp pain.  He states he still playing tennis and that does not seem to bother it much.  Heating helps with comfort.  He denies any recent illness or URI.  He states he had noticed he is needed his albuterol a little bit more frequently the last few days using it once a day.  He denies any fever, cough or shortness of breath.  He reports before needing albuterol last couple  days, he rarely used his albuterol throughout the year..   Depression screen [facility] 2/9 01/17/2018 12/08/2016 12/07/2015  Decreased Interest 0 0 0  Down, Depressed, Hopeless 0 0 0  PHQ - 2 Score 0 0 0    No Known Allergies Social History   Social History Narrative   Warren Hall lives with his daughter. He works Medical laboratory scientific officer.   Past Medical History:  Diagnosis Date   Allergy    Seasonal   Frequent headaches    Hyperlipidemia    Lumbar herniated disc 2001   conservative treatment   Migraines    Seasonal asthma    Past Surgical History:  Procedure Laterality Date   ACHILLES TENDON REPAIR     LASIK     Family History  Problem  Relation Age of Onset   COPD Mother    Hypertension Father    Colon polyps Father    COPD Maternal Grandmother    Multiple sclerosis Maternal Grandfather    Cancer Paternal Grandfather        lung   Colon cancer Neg Hx    Esophageal cancer Neg Hx    Rectal cancer Neg Hx    Stomach cancer Neg Hx    Allergies as of 08/01/2018   No Known Allergies     Medication List       Accurate as of August 01, 2018  3:58 PM. If you have any questions, ask your nurse or doctor.        albuterol 108 (90 Base) MCG/ACT inhaler Commonly known as: VENTOLIN HFA Inhale 2 puffs into the lungs every 4 (four) hours as needed for wheezing.   atorvastatin 80 MG tablet Commonly known as: LIPITOR Take 1 tablet (80 mg total) by mouth daily.   B-12 1000 MCG Caps Take 1 tablet by mouth daily.   B-complex with vitamin C tablet Take 1 tablet by mouth daily.   cholecalciferol 25 MCG (1000 UT) tablet Commonly known as: VITAMIN D3 Take 1,000 Units by mouth daily.   cyclobenzaprine 10 MG tablet Commonly known as: FLEXERIL Take 1 tablet (10 mg total) by mouth 3 (three) times daily as needed for muscle spasms.   gabapentin 300 MG capsule Commonly known as: NEURONTIN Take 300 mg by mouth 3 (three) times daily. 1 T am, 2 T afternoon, 1 T pm   naproxen 500 MG tablet Commonly known as: Naprosyn Take 1 tablet (500 mg total) by mouth 2 (two) times daily with a meal.   Omega-3 1000 MG Caps Take 1,000 mg by mouth daily.   omeprazole 40 MG capsule Commonly known as: PRILOSEC Take 1 capsule (40 mg total) by mouth daily.   SUMAtriptan 100 MG tablet Commonly known as: Imitrex Take 1 tablet (100 mg total) by mouth once for 1 dose. May repeat in 2 hours if headache persists or recurs.       All past medical history, surgical history, allergies, family history, immunizations andmedications were updated in the EMR today and reviewed under the history and medication portions of their EMR.     ROS:  Negative, with the exception of above mentioned in HPI   Objective:  Ht 6\' 1"  (1.854 m)    BMI 27.33 kg/m  Body mass index is 27.33 kg/m. Gen: Afebrile. No acute distress.  Nontoxic in presentation.  Moving with ease. HENT: AT. .MMM. Eyes:Pupils Equal Round Reactive to light, Extraocular movements intact,  Conjunctiva without redness, discharge or icterus. CV: No edema, +2/4 P posterior tibialis pulses Chest:  No cough or shortness of breath on exam MSK: No erythema.  Tenderness to palpation over thoracic/rib cage on his right lateral side.  Approximately around T9 -12 right location.  He has full range of motion of his thoracic and lumbar spine.  He has some discomfort with returning from flexion to extension.  Neurovascular intact distally Skin: No rashes, purpura or petechiae.  Neuro:  Normal gait. PERLA. EOMi. Alert. Oriented x3  Psych: Normal affect, dress and demeanor. Normal speech. Normal thought content and judgment. .  No exam data present No results found. No results found for this or any previous visit (from the past 24 hour(s)).  Assessment/Plan: Clifford Benninger. is a 53 y.o. male present for OV for  Acute right-sided rib pain/acute right-sided thoracic back pain/shortness of breath/COVID education 2-3-week history of right thoracic rib/back pain that has persisted despite steroid shot and muscle relaxer.  Pain does seem muscle skeletal in nature with reproduction of symptoms with movement and palpation.  However he has new onset intermittent shortness of breath which is concerning-which is requiring him to use his albuterol frequently.  He is taking his allergy regimen.  - Possibly 2 separate conditions occurring. -Continue Flexeril since he is having at least some relief with use.   -Continue heat application. -We will obtain chest x-ray to rule out infectious/lung causes of his discomfort and obtain right rib series to rule out any bony abnormality. - Patient was  encouraged to continue Flonase nasal spray and antihistamines for his asthma.   -Continue naproxen twice daily with meals for pain and anti-inflammatory effect. -COVID-19 testing arranged.  COVID-19 education and quarantine instructions discussed. -Await x-rays to discuss further plan.  If asthma flare may benefit from prednisone taper plus/minus Z-Pak depending upon x-ray results.  Patient was encouraged to go today so that we could have the results back by tomorrow secondary to entering the holiday weekend.     Reviewed expectations re: course of current medical issues.  Discussed self-management of symptoms.  Outlined signs and symptoms indicating need for more acute intervention.  Patient verbalized understanding and all questions were answered.  Patient received an After-Visit Summary.    No orders of the defined types were placed in this encounter.   > 25 minutes spent with patient, >50% of time spent face to face      Note is dictated utilizing voice recognition software. Although note has been proof read prior to signing, occasional typographical errors still can be missed. If any questions arise, please do not hesitate to call for verification.   electronically signed by:  Howard Pouch, DO  [place]

## 2018-08-02 ENCOUNTER — Telehealth: Payer: Self-pay | Admitting: Family Medicine

## 2018-08-02 ENCOUNTER — Telehealth: Payer: Self-pay | Admitting: *Deleted

## 2018-08-02 ENCOUNTER — Other Ambulatory Visit: Payer: Self-pay

## 2018-08-02 ENCOUNTER — Encounter: Payer: Self-pay | Admitting: Family Medicine

## 2018-08-02 ENCOUNTER — Ambulatory Visit (INDEPENDENT_AMBULATORY_CARE_PROVIDER_SITE_OTHER): Payer: BC Managed Care – PPO

## 2018-08-02 DIAGNOSIS — R0781 Pleurodynia: Secondary | ICD-10-CM | POA: Diagnosis not present

## 2018-08-02 DIAGNOSIS — M546 Pain in thoracic spine: Secondary | ICD-10-CM | POA: Diagnosis not present

## 2018-08-02 DIAGNOSIS — Z20822 Contact with and (suspected) exposure to covid-19: Secondary | ICD-10-CM

## 2018-08-02 DIAGNOSIS — R0602 Shortness of breath: Secondary | ICD-10-CM

## 2018-08-02 DIAGNOSIS — R6889 Other general symptoms and signs: Secondary | ICD-10-CM | POA: Diagnosis not present

## 2018-08-02 NOTE — Telephone Encounter (Signed)
Patient advised, expressed understanding.

## 2018-08-02 NOTE — Telephone Encounter (Signed)
-----   Message from Caroll Rancher, LPN sent at 02/06/5788  4:11 PM EDT ----- Regarding: COVID test Warren Hall  1965/03/06 MRN [id number] [name] Reason: Shortness breath

## 2018-08-02 NOTE — Telephone Encounter (Signed)
Please inform patient the following information: cxr and rib xrays are normal. No lung or bony abnormality to explain his symptoms. Continue to treat and MSK cause and await covid 19 test result.

## 2018-08-02 NOTE — Telephone Encounter (Signed)
Pt sent my chart message as a FYI- Urgent care closed at 6pm due to [place], will go get x-rays this morning

## 2018-08-02 NOTE — Telephone Encounter (Signed)
Pt scheduled for covid testing at 12:15 @ [facility]. Instructions given and order placed.

## 2018-08-09 LAB — NOVEL CORONAVIRUS, NAA: SARS-CoV-2, NAA: NOT DETECTED

## 2018-12-06 DIAGNOSIS — R682 Dry mouth, unspecified: Secondary | ICD-10-CM | POA: Diagnosis not present

## 2018-12-06 DIAGNOSIS — Z20828 Contact with and (suspected) exposure to other viral communicable diseases: Secondary | ICD-10-CM | POA: Diagnosis not present

## 2018-12-15 DIAGNOSIS — Z20828 Contact with and (suspected) exposure to other viral communicable diseases: Secondary | ICD-10-CM | POA: Diagnosis not present

## 2018-12-15 DIAGNOSIS — B349 Viral infection, unspecified: Secondary | ICD-10-CM | POA: Diagnosis not present

## 2018-12-19 ENCOUNTER — Encounter: Payer: Self-pay | Admitting: Family Medicine

## 2018-12-19 ENCOUNTER — Other Ambulatory Visit: Payer: Self-pay

## 2018-12-19 ENCOUNTER — Ambulatory Visit (INDEPENDENT_AMBULATORY_CARE_PROVIDER_SITE_OTHER): Payer: BC Managed Care – PPO | Admitting: Family Medicine

## 2018-12-19 VITALS — Ht 73.0 in

## 2018-12-19 DIAGNOSIS — M7542 Impingement syndrome of left shoulder: Secondary | ICD-10-CM | POA: Diagnosis not present

## 2018-12-19 MED ORDER — NAPROXEN 500 MG PO TABS: 500.0000 mg | ORAL_TABLET | Freq: Two times a day (BID) | ORAL | 0 refills | Status: DC

## 2018-12-19 NOTE — Progress Notes (Signed)
VIRTUAL VISIT VIA VIDEO  I connected with Warren Hall. on 12/19/18 at  4:00 PM EST by a video enabled telemedicine application and verified that I am speaking with the correct person using two identifiers. Location patient: Home Location provider: [facility], Office Persons participating in the virtual visit: Patient, Dr. Raoul Pitch and R.Baker, LPN  I discussed the limitations of evaluation and management by telemedicine and the availability of in person appointments. The patient expressed understanding and agreed to proceed.  Interactive audio and video telecommunications were attempted between this provider and patient, however failed, due to patient having technical difficulties OR patient did not have access to video capability. We continued and completed visit with audio only.   SUBJECTIVE Chief Complaint  Patient presents with  . Shoulder Pain    Pt has been having left shoulder pain x2 months. It has gotten worse recently. He states it hurts the most when sleeping and reaching over his head.     HPI: Warren Jimison. is a 53 y.o. male pt who presents today to discuss his left shoulder pain.  Patient reports his shoulder has slowly been worsening over the last 2 months.  It now has gotten to the point where he cannot lift his arm past his shoulder without discomfort.  He had a similar occurrence in his right shoulder a few years ago and reports he received a steroid injection in the shoulder joint and his symptoms resolved and never returned.  He has never had this issue in the left throat or until the last 2 months.  He is a very active male and plays tennis routinely.  He states certain tendon strokes are now causing him more discomfort and he cannot tolerate playing as much.  He denies any injury prior to onset of discomfort.  He denies any prior shoulder surgeries.  ROS: See pertinent positives and negatives per HPI.  Patient Active Problem List   Diagnosis Date  Noted  . Colon polyp 01/11/2017  . Vitamin D deficiency 12/08/2016  . Prostate cancer screening 12/03/2014  . Hyperlipidemia 12/03/2014  . Encounter for preventive health examination 12/03/2014  . Overweight (BMI 25.0-29.9) 05/13/2014  . Degeneration of lumbar or lumbosacral intervertebral disc 12/02/2013  . Lumbar spondylosis with myelopathy 11/27/2013  . Spondylosis, cervical, with myelopathy 11/27/2013  . Seasonal asthma 11/27/2013  . GERD (gastroesophageal reflux disease) 11/27/2013  . Migraine without aura and without status migrainosus, not intractable 11/21/2013    Social History   Tobacco Use  . Smoking status: Former Research scientist (life sciences)  . Smokeless tobacco: Never Used  . Tobacco comment: on and off 30 years ago  Substance Use Topics  . Alcohol use: Yes    Comment: weekly    Current Outpatient Medications:  .  albuterol (PROVENTIL HFA;VENTOLIN HFA) 108 (90 Base) MCG/ACT inhaler, Inhale 2 puffs into the lungs every 4 (four) hours as needed for wheezing., Disp: 1 Inhaler, Rfl: 1 .  atorvastatin (LIPITOR) 80 MG tablet, Take 1 tablet (80 mg total) by mouth daily., Disp: 90 tablet, Rfl: 3 .  B Complex-C (B-COMPLEX WITH VITAMIN C) tablet, Take 1 tablet by mouth daily., Disp: , Rfl:  .  cholecalciferol (VITAMIN D3) 25 MCG (1000 UT) tablet, Take 1,000 Units by mouth daily., Disp: , Rfl:  .  Cyanocobalamin (B-12) 1000 MCG CAPS, Take 1 tablet by mouth daily., Disp: , Rfl:  .  Omega-3 1000 MG CAPS, Take 1,000 mg by mouth daily., Disp: , Rfl:  .  omeprazole (PRILOSEC) 40 MG capsule, Take 1 capsule (40 mg total) by mouth daily., Disp: 90 capsule, Rfl: 3 .  SUMAtriptan (IMITREX) 100 MG tablet, Take 1 tablet (100 mg total) by mouth once for 1 dose. May repeat in 2 hours if headache persists or recurs., Disp: 15 tablet, Rfl: 5  No Known Allergies  OBJECTIVE: Ht 6\' 1"  (1.854 m)   BMI 27.33 kg/m  Gen: No acute distress.  Chest: Cough or shortness of breath not present  Neuro:  Alert.    ASSESSMENT AND PLAN: Warren Booras. is a 53 y.o. male present for  Shoulder impingement syndrome, left Patient symptoms seem to be consistent with shoulder impingement syndrome.  Discussed options with him today and he would like to be referred to sports med for further evaluation and consideration of steroid injection.  In the meantime I have encouraged him to start the naproxen twice daily with meals for the anti-inflammatory effect.  I have prescribed this for him. -Avoidance of overactivity.  Encouraged him to take at least 2 weeks off from tennis, maybe longer depending upon sports medicine's in person evaluation. - Ambulatory referral to Sports Medicine  > 15 minutes spent with patient, > 50% of that time face to face   Howard Pouch, DO 12/19/2018

## 2018-12-24 ENCOUNTER — Telehealth: Payer: Self-pay

## 2018-12-24 ENCOUNTER — Ambulatory Visit (INDEPENDENT_AMBULATORY_CARE_PROVIDER_SITE_OTHER): Payer: BC Managed Care – PPO | Admitting: Family Medicine

## 2018-12-24 ENCOUNTER — Ambulatory Visit: Payer: Self-pay

## 2018-12-24 ENCOUNTER — Other Ambulatory Visit: Payer: Self-pay

## 2018-12-24 VITALS — BP 128/86 | HR 67 | Ht 73.0 in | Wt 219.0 lb

## 2018-12-24 DIAGNOSIS — M25512 Pain in left shoulder: Secondary | ICD-10-CM

## 2018-12-24 DIAGNOSIS — M7582 Other shoulder lesions, left shoulder: Secondary | ICD-10-CM

## 2018-12-24 NOTE — Progress Notes (Signed)
Subjective:    I'm seeing this patient as Hall consultation for:  Warren Hall, Warren A, DO.  Note will be sent back to referring provider/PCP.   CC: Left Shoulder Pain  HPI:  Warren Hall is Hall left-hand-dominant male who developed left shoulder pain over the last several months.  He denies any injury.  Pain is located predominantly left lateral upper arm.  Pain is worse with activity.  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  Did similar episode in his right shoulder 8 years ago which improved with home exercise program and cortisone injection. . Feels weakness at end ranges of motion especially with serving. Would like injection in shoulder today. Denies any radiating symptoms.   Past medical history, Surgical history, Family history not pertinant except as noted below, Social history, Allergies, and medications have been entered into the medical record, reviewed, and no changes needed.   Review of Systems: No headache, visual changes, nausea, vomiting, diarrhea, constipation, dizziness, abdominal pain, skin rash, fevers, chills, night sweats, weight loss, swollen lymph nodes, body aches, joint swelling, muscle aches, chest pain, shortness of breath, mood changes, visual or auditory hallucinations.   Objective:    Vitals:   12/24/18 1522  BP: 128/86  Pulse: 67  SpO2: 97%   General: Well Developed, well nourished, and in no acute distress.  Neuro/Psych: Alert and oriented x3, extra-ocular muscles intact, able to move all 4 extremities, sensation grossly intact. Skin: Warm and dry, no rashes noted.  Respiratory: Not using accessory muscles, speaking in full sentences, trachea midline.  Cardiovascular: Pulses palpable, no extremity edema. Abdomen: Does not appear distended. MSK:  Left shoulder: Normal-appearing Range of motion abduction full however pain beyond 100 degrees. External rotation full. Internal rotation to lumbar spine. Strength intact abduction  external/internal rotation. Positive Hawkins and Neer's test. Positive empty can test. Negative Yergason's and speeds test.  Right shoulder: Normal-appearing Normal range of motion. Normal strength. Negative impingement testing.  Pulses cap refill and sensation intact bilateral upper extremities.  Lab and Radiology Results Procedure: Real-time Ultrasound Guided Injection of left subacromial bursa Device: Philips Affiniti 50G Images permanently stored and available for review in the ultrasound unit. Verbal informed consent obtained.  Discussed risks and benefits of procedure. Warned about infection bleeding damage to structures skin hypopigmentation and fat atrophy among others. Patient expresses understanding and agreement Time-out conducted.   Noted no overlying erythema, induration, or other signs of local infection.   Skin prepped in Hall sterile fashion.   Local anesthesia: Topical Ethyl chloride.   With sterile technique and under real time ultrasound guidance:  40 mg of Kenalog and 2 mL of Marcaine injected easily.   Completed without difficulty   Pain immediately resolved suggesting accurate placement of the medication.   Of note: During inspection of supraspinatus and subacromial area on ultrasound calcific changes are seen at the distal insertion of the supraspinatus tendon indicating calcific tendinopathy.  No visible rotator cuff tear present.  Advised to call if fevers/chills, erythema, induration, drainage, or persistent bleeding.   Images permanently stored and available for review in the ultrasound unit.  Impression: Technically successful ultrasound guided injection.       Impression and Recommendations:    Assessment and Plan: 53 y.o. male with left shoulder pain.  Rotator cuff tendinopathy.  Plan to treat with home exercise program, injection as above.  If not improving will proceed with formal physical therapy.  Recheck back as needed.  Precautions  reviewed.Marland Kitchen   PDMP not reviewed this encounter. Orders Placed This Encounter  Procedures  . [place] LIMITED JOINT SPACE STRUCTURES UP LEFT(NO LINKED CHARGES)    Standing Status:   Future    Number of Occurrences:   1    Standing Expiration Date:   06/23/2019    Order Specific Question:   Reason for Exam (SYMPTOM  OR DIAGNOSIS REQUIRED)    Answer:   left shoulder pain    Order Specific Question:   Preferred imaging location?    Answer:   Internal   No orders of the defined types were placed in this encounter.   Discussed warning signs or symptoms. Please see discharge instructions. Patient expresses understanding.  The above documentation has been reviewed and is accurate and complete Lynne Leader

## 2018-12-24 NOTE — Telephone Encounter (Signed)
Copied from [place] [id number]. Topic: General - Other >> Dec 24, 2018 11:29 AM Jodie Echevaria wrote: Reason for CRM: Diane from [place] called to get patient referred to Dr Tommi Rumps and to have him scheduled as soon as possible. She is asking for an update in the referral section please.

## 2018-12-24 NOTE — Patient Instructions (Signed)
Thank you for coming in today.  Work on home exercises.  Recheck as needed.   Call or go to the ER if you develop a large red swollen joint with extreme pain or oozing puss.    Shoulder Impingement Syndrome  Shoulder impingement syndrome is a condition that causes pain when connective tissues (tendons) surrounding the shoulder joint become pinched. These tendons are part of the group of muscles and tissues that help to stabilize the shoulder (rotator cuff). Beneath the rotator cuff is a fluid-filled sac (bursa) that allows the muscles and tendons to glide smoothly. The bursa may become swollen or irritated (bursitis). Bursitis, swelling in the rotator cuff tendons, or both conditions can decrease how much space is under a bone in the shoulder joint (acromion), resulting in impingement. What are the causes? Shoulder impingement syndrome may be caused by bursitis or swelling of the rotator cuff tendons, which may result from:  Repetitive overhead arm movements.  Falling onto the shoulder.  Weakness in the shoulder muscles. What increases the risk? You may be more likely to develop this condition if you:  Play sports that involve throwing, such as baseball.  Participate in sports such as tennis, volleyball, and swimming.  Work as a Curator, Games developer, or Architect. Some people are also more likely to develop impingement syndrome because of the shape of their acromion bone. What are the signs or symptoms? The main symptom of this condition is pain on the front or side of the shoulder. The pain may:  Get worse when lifting or raising the arm.  Get worse at night.  Wake you up from sleeping.  Feel sharp when the shoulder is moved and then fade to an ache. Other symptoms may include:  Tenderness.  Stiffness.  Inability to raise the arm above shoulder level or behind the body.  Weakness. How is this diagnosed? This condition may be diagnosed based on:  Your symptoms and  medical history.  A physical exam.  Imaging tests, such as: ? X-rays. ? MRI. ? Ultrasound. How is this treated? This condition may be treated by:  Resting your shoulder and avoiding all activities that cause pain or put stress on the shoulder.  Icing your shoulder.  NSAIDs to help reduce pain and swelling.  One or more injections of medicines to numb the area and reduce inflammation.  Physical therapy.  Surgery. This may be needed if nonsurgical treatments have not helped. Surgery may involve repairing the rotator cuff, reshaping the acromion, or removing the bursa. Follow these instructions at home: Managing pain, stiffness, and swelling   If directed, put ice on the injured area. ? Put ice in a plastic bag. ? Place a towel between your skin and the bag. ? Leave the ice on for 20 minutes, 2-3 times a day. Activity  Rest and return to your normal activities as told by your health care provider. Ask your health care provider what activities are safe for you.  Do exercises as told by your health care provider. General instructions  Do not use any products that contain nicotine or tobacco, such as cigarettes, e-cigarettes, and chewing tobacco. These can delay healing. If you need help quitting, ask your health care provider.  Ask your health care provider when it is safe for you to drive.  Take over-the-counter and prescription medicines only as told by your health care provider.  Keep all follow-up visits as told by your health care provider. This is important. How is this prevented?  Give your body time to rest between periods of activity.  Be safe and responsible while being active. This will help you avoid falls.  Maintain physical fitness, including strength and flexibility. Contact a health care provider if:  Your symptoms have not improved after 1-2 months of treatment and rest.  You cannot lift your arm away from your body. Summary  Shoulder impingement  syndrome is a condition that causes pain when connective tissues (tendons) surrounding the shoulder joint become pinched.  The main symptom of this condition is pain on the front or side of the shoulder.  This condition is usually treated with rest, ice, and pain medicines as needed. This information is not intended to replace advice given to you by your health care provider. Make sure you discuss any questions you have with your health care provider. Document Released: 01/17/2005 Document Revised: 05/11/2018 Document Reviewed: 07/12/2017 Elsevier Patient Education  2020 [employer].

## 2019-01-17 ENCOUNTER — Telehealth: Payer: Self-pay | Admitting: Family Medicine

## 2019-01-17 NOTE — Telephone Encounter (Signed)
Schedule virtual appt 12/18

## 2019-01-18 ENCOUNTER — Other Ambulatory Visit: Payer: Self-pay

## 2019-01-18 ENCOUNTER — Ambulatory Visit (INDEPENDENT_AMBULATORY_CARE_PROVIDER_SITE_OTHER): Payer: BC Managed Care – PPO | Admitting: Family Medicine

## 2019-01-18 ENCOUNTER — Encounter: Payer: Self-pay | Admitting: Family Medicine

## 2019-01-18 VITALS — Ht 73.0 in

## 2019-01-18 DIAGNOSIS — E785 Hyperlipidemia, unspecified: Secondary | ICD-10-CM | POA: Diagnosis not present

## 2019-01-18 DIAGNOSIS — K219 Gastro-esophageal reflux disease without esophagitis: Secondary | ICD-10-CM | POA: Diagnosis not present

## 2019-01-18 DIAGNOSIS — E663 Overweight: Secondary | ICD-10-CM | POA: Diagnosis not present

## 2019-01-18 MED ORDER — ATORVASTATIN CALCIUM 80 MG PO TABS: 80.0000 mg | ORAL_TABLET | Freq: Every day | ORAL | 1 refills | Status: DC

## 2019-01-18 MED ORDER — OMEPRAZOLE 40 MG PO CPDR: 40.0000 mg | DELAYED_RELEASE_CAPSULE | Freq: Every day | ORAL | 3 refills | Status: DC

## 2019-01-18 NOTE — Patient Instructions (Signed)
BMI for Adults  Body mass index (BMI) is a number that is calculated from a person's weight and height. BMI may help to estimate how much of a person's weight is composed of fat. BMI can help identify those who may be at higher risk for certain medical problems. How is BMI used with adults? BMI is used as a screening tool to identify possible weight problems. It is used to check whether a person is obese, overweight, healthy weight, or underweight. How is BMI calculated? BMI measures your weight and compares it to your height. This can be done either in [place] (U[place] or metric measurements. Note that charts are available to help you find your BMI quickly and easily without having to do these calculations yourself. To calculate your BMI in English (U.S.) measurements, your health care provider will: 1. Measure your weight in pounds (lb). 2. Multiply the number of pounds by 703. ? For example, for a person who weighs 180 lb, multiply that number by 703, which equals 126,540. 3. Measure your height in inches (in). Then multiply that number by itself to get a measurement called "inches squared." ? For example, for a person who is 70 in tall, the "inches squared" measurement is 70 in x 70 in, which equals 4900 inches squared. 4. Divide the total from Step 2 (number of lb x 703) by the total from Step 3 (inches squared): 126,540  4900 = 25.8. This is your BMI. To calculate your BMI in metric measurements, your health care provider will: 1. Measure your weight in kilograms (kg). 2. Measure your height in meters (m). Then multiply that number by itself to get a measurement called "meters squared." ? For example, for a person who is 1.75 m tall, the "meters squared" measurement is 1.75 m x 1.75 m, which is equal to 3.1 meters squared. 3. Divide the number of kilograms (your weight) by the meters squared number. In this example: 70  3.1 = 22.6. This is your BMI. How is BMI interpreted? To interpret your  results, your health care provider will use BMI charts to identify whether you are underweight, normal weight, overweight, or obese. The following guidelines will be used:  Underweight: BMI less than 18.5.  Normal weight: BMI between 18.5 and 24.9.  Overweight: BMI between 25 and 29.9.  Obese: BMI of 30 and above. Please note:  Weight includes both fat and muscle, so someone with a muscular build, such as an athlete, may have a BMI that is higher than 24.9. In cases like these, BMI is not an accurate measure of body fat.  To determine if excess body fat is the cause of a BMI of 25 or higher, further assessments may need to be done by a health care provider.  BMI is usually interpreted in the same way for men and women. Why is BMI a useful tool? BMI is useful in two ways:  Identifying a weight problem that may be related to a medical condition, or that may increase the risk for medical problems.  Promoting lifestyle and diet changes in order to reach a healthy weight. Summary  Body mass index (BMI) is a number that is calculated from a person's weight and height.  BMI may help to estimate how much of a person's weight is composed of fat. BMI can help identify those who may be at higher risk for certain medical problems.  BMI can be measured using English measurements or metric measurements.  To interpret your results, your  health care provider will use BMI charts to identify whether you are underweight, normal weight, overweight, or obese. This information is not intended to replace advice given to you by your health care provider. Make sure you discuss any questions you have with your health care provider. Document Released: 09/29/2003 Document Revised: 12/30/2016 Document Reviewed: 11/30/2016 Elsevier Patient Education  2020 [employer].

## 2019-01-18 NOTE — Progress Notes (Signed)
VIRTUAL VISIT VIA VIDEO  I connected with Warren Hall. on 01/18/19 at  8:30 AM EST by a video enabled telemedicine application and verified that I am speaking with the correct person using two identifiers. Location patient: Home Location provider: [facility], Office Persons participating in the virtual visit: Patient, Dr. Raoul Pitch and R.Baker, LPN  I discussed the limitations of evaluation and management by telemedicine and the availability of in person appointments. The patient expressed understanding and agreed to proceed.       Warren Hall , 01/24/1966, 53 y.o., male MRN: [id number] Patient Care Team    Relationship Specialty Notifications Start End  [name], DO PCP - General Family Medicine  09/29/14   Zonia Kief, MD  Rehabilitation  12/09/16   Yetta Flock, MD Consulting Physician Gastroenterology  01/17/18     Chief Complaint  Patient presents with  . Gastroesophageal Reflux    Needs refills   . Hyperlipidemia     Subjective: Warren Hall. is a 53 y.o. male present for chronic condition follow-up Hyperlipidemia:Patient reports compliance with Lipitor 40 mg QD.  He has been tolerating medication well.  He is not getting as much activity secondary to pandemic and musculoskeletal injuries.  He is due for yearly cholesterol blood work.  GERD: Patient reports he is doing well on omeprazole 40 mg daily.  He has been unable to taper down on medication.  Vitamin D, B12 and mag was monitored 12/2017-all within normal limits. Depression screen [facility] 2/9 01/17/2018 12/08/2016 12/07/2015  Decreased Interest 0 0 0  Down, Depressed, Hopeless 0 0 0  PHQ - 2 Score 0 0 0    No Known Allergies Social History   Social History Narrative   Mr [name] lives with his daughter. He works Medical laboratory scientific officer.   Past Medical History:  Diagnosis Date  . Allergy    Seasonal  . Frequent headaches   . Hyperlipidemia   . Lumbar herniated disc 2001   conservative  treatment  . Migraines   . Seasonal asthma    Past Surgical History:  Procedure Laterality Date  . ACHILLES TENDON REPAIR    . LASIK     Family History  Problem Relation Age of Onset  . COPD Mother   . Hypertension Father   . Colon polyps Father   . COPD Maternal Grandmother   . Multiple sclerosis Maternal Grandfather   . Cancer Paternal Grandfather        lung  . Colon cancer Neg Hx   . Esophageal cancer Neg Hx   . Rectal cancer Neg Hx   . Stomach cancer Neg Hx    Allergies as of 01/18/2019   No Known Allergies     Medication List       Accurate as of January 18, 2019  8:41 AM. If you have any questions, ask your nurse or doctor.        albuterol 108 (90 Base) MCG/ACT inhaler Commonly known as: VENTOLIN HFA Inhale 2 puffs into the lungs every 4 (four) hours as needed for wheezing.   atorvastatin 80 MG tablet Commonly known as: LIPITOR Take 1 tablet (80 mg total) by mouth daily.   B-12 1000 MCG Caps Take 1 tablet by mouth daily.   B-complex with vitamin C tablet Take 1 tablet by mouth daily.   cholecalciferol 25 MCG (1000 UT) tablet Commonly known as: VITAMIN D3 Take 1,000 Units by mouth daily.  naproxen 500 MG tablet Commonly known as: Naprosyn Take 1 tablet (500 mg total) by mouth 2 (two) times daily with a meal.   Omega-3 1000 MG Caps Take 1,000 mg by mouth daily.   omeprazole 40 MG capsule Commonly known as: PRILOSEC Take 1 capsule (40 mg total) by mouth daily.   SUMAtriptan 100 MG tablet Commonly known as: Imitrex Take 1 tablet (100 mg total) by mouth once for 1 dose. May repeat in 2 hours if headache persists or recurs.       All past medical history, surgical history, allergies, family history, immunizations andmedications were updated in the EMR today and reviewed under the history and medication portions of their EMR.     ROS: Negative, with the exception of above mentioned in HPI   Objective:  Ht 6\' 1"  (1.854 m)   BMI 28.89  kg/m  Body mass index is 28.89 kg/m. Gen: Afebrile. No acute distress. Nontoxic in appearance, well developed, well nourished.  Pleasant Caucasian male. HENT: AT. [place].  Eyes:Pupils Equal Round Reactive to light, Extraocular movements intact,  Conjunctiva without redness, discharge or icterus. Chest: no cough or shortness of breath Neuro:  Alert. Oriented x3  Psych: Normal affect, dress and demeanor. Normal speech. Normal thought content and judgment.  No exam data present No results found. No results found for this or any previous visit (from the past 24 hour(s)).  Assessment/Plan: Warren Hall. is a 53 y.o. male present for OV for [facility] Gastroesophageal reflux disease, esophagitis presence not specified/long-term PPI - Has been unable to decrease top QOD.  - continue/refilled prilosec 40 mg QD.  -Vitamin D, B12 and mag within normal limits 12/2017.  Monitor every 2 to 3 years for long-term PPI use. - omeprazole (PRILOSEC) 40 MG capsule; Take 1 capsule (40 mg total) by mouth daily.  Dispense: 90 capsule; Refill: 3 May follow yearly with CPE  Hyperlipidemia, unspecified hyperlipidemia type/overweight - Dietary modifications  and exercise encouraged - continue Lipitor 40--> refilled>> wait to perform lab for his CPE (encouraged him to schedule in 3-4 months)       Reviewed expectations re: course of current medical issues.  Discussed self-management of symptoms.  Outlined signs and symptoms indicating need for more acute intervention.  Patient verbalized understanding and all questions were answered.  Patient received an After-Visit Summary.   > 15 minutes spent with patient, > 50% of that time face to face   No orders of the defined types were placed in this encounter.    Note is dictated utilizing voice recognition software. Although note has been proof read prior to signing, occasional typographical errors still can be missed. If any questions arise, please do not  hesitate to call for verification.   electronically signed by:  Howard Pouch, DO  [place]

## 2019-01-21 ENCOUNTER — Encounter: Payer: BLUE CROSS/BLUE SHIELD | Admitting: Family Medicine

## 2019-02-14 ENCOUNTER — Other Ambulatory Visit: Payer: Self-pay

## 2019-02-14 DIAGNOSIS — G43009 Migraine without aura, not intractable, without status migrainosus: Secondary | ICD-10-CM

## 2019-02-14 MED ORDER — SUMATRIPTAN SUCCINATE 100 MG PO TABS: ORAL_TABLET | ORAL | 5 refills | Status: DC

## 2019-02-14 NOTE — Telephone Encounter (Signed)
Refilled for him.  

## 2019-02-14 NOTE — Telephone Encounter (Signed)
RF request for Sumatriptan 100mg  LOV: 01/18/2019 CPE  Next ov: Not scheduled  Last written: 01/17/2018 #15 x5 refills   [facility]  

## 2019-03-06 ENCOUNTER — Ambulatory Visit (INDEPENDENT_AMBULATORY_CARE_PROVIDER_SITE_OTHER): Payer: BC Managed Care – PPO | Admitting: Family Medicine

## 2019-03-06 ENCOUNTER — Other Ambulatory Visit: Payer: Self-pay

## 2019-03-06 ENCOUNTER — Ambulatory Visit (INDEPENDENT_AMBULATORY_CARE_PROVIDER_SITE_OTHER): Payer: BC Managed Care – PPO

## 2019-03-06 ENCOUNTER — Encounter: Payer: Self-pay | Admitting: Family Medicine

## 2019-03-06 VITALS — BP 124/84 | HR 84 | Ht 73.0 in | Wt 218.8 lb

## 2019-03-06 DIAGNOSIS — M25512 Pain in left shoulder: Secondary | ICD-10-CM

## 2019-03-06 DIAGNOSIS — M19012 Primary osteoarthritis, left shoulder: Secondary | ICD-10-CM | POA: Diagnosis not present

## 2019-03-06 NOTE — Patient Instructions (Signed)
Thank you for coming in today. Get xray today.  Plan for MRI soon.  Let me know if you have not been contacted about MRI scheduling in 1 week.  Phone number for Radiology at [facility] is [phone]. I will get report of MRI to you ASAP with a plan.

## 2019-03-06 NOTE — Progress Notes (Signed)
I, Warren Hall, LAT, ATC, am serving as scribe for Dr. Lynne Hall.  Warren Hall. is a 54 y.o. male who presents to [place] at [facility] today for f/u of L shoulder pain.  He was last seen by Dr. Georgina Hall on 12/24/18 and had a L subacromial injection.  He noted L shoulder and lateral upper arm pain that was worse when playing tennis.  He was provided a HEP by Dr. Georgina Hall.  Since his last visit, pt reports that his L shoulder is feeling progressively worse and is having radiating pain into the L UE sometimes to the hand.  He states that it will wake him up at night if he rolls onto his L shoulder.  Aggravating factors include L shoulder end-range flexion and quick reaching motions into aBduction.  He has been doing his HeP 3x/week.   Pertinent review of systems: No fevers or chills.  Relevant historical information: History of GERD and hyperlipidemia.   Exam:  BP 124/84 (BP Location: Left Arm, Patient Position: Sitting, Cuff Size: Large)   Pulse 84   Ht 6\' 1"  (1.854 m)   Wt 218 lb 12.8 oz (99.2 kg)   SpO2 94%   BMI 28.87 kg/m  General: Well Developed, well nourished, and in no acute distress.   MSK: Left shoulder normal-appearing Nontender. Range of motion abduction 140 degrees.  Active and passive. Normal external rotation. Internal rotation limited to SI joint region. Strength intact abduction external and internal rotation. Positive empty can test. Positive Hawkins and Neer's test Negative Yergason's and speeds test.  Right shoulder normal-appearing nontender normal motion normal strength negative impingement testing.    Lab and Radiology Results  X-ray images left shoulder obtained today personally and independently reviewed. Mild elevation humeral head compared to glenoid. Rounded calcific change present at supraspinatus insertion. AC DJD. Await formal radiology review     Assessment and Plan: 54 y.o. male with persistent left shoulder  pain failing conservative management since mid November including home exercise program and injection.  At this point pain is worsening and interfering with normal exercise and normal activities of daily living.  We will proceed with MRI for surgical or further injection planning.    Orders Placed This Encounter  Procedures  . DG Shoulder Left    Standing Status:   Future    Number of Occurrences:   1    Standing Expiration Date:   05/03/2020    Order Specific Question:   Reason for Exam (SYMPTOM  OR DIAGNOSIS REQUIRED)    Answer:   eval shoulder pain. Prep for MRI    Order Specific Question:   Preferred imaging location?    Answer:   [facility]    Order Specific Question:   Radiology Contrast Protocol - do NOT remove file path    Answer:   \\[email or web address]\Radiant\[email or web address].pdf  . MR SHOULDER LEFT WO CONTRAST    Standing Status:   Future    Standing Expiration Date:   05/03/2020    Order Specific Question:   What is the patient's sedation requirement?    Answer:   No Sedation    Order Specific Question:   Does the patient have a pacemaker or implanted devices?    Answer:   No    Order Specific Question:   Preferred imaging location?    Answer:   [facility] (table limit-350lbs)    Order Specific Question:   Radiology Contrast Protocol - do NOT remove file path  Answer:   \\[email or web address]\Radiant\[email or web address].PDF   No orders of the defined types were placed in this encounter.    Discussed warning signs or symptoms. Please see discharge instructions. Patient expresses understanding.   The above documentation has been reviewed and is accurate and complete Warren Hall

## 2019-03-07 ENCOUNTER — Telehealth: Payer: Self-pay | Admitting: Family Medicine

## 2019-03-07 MED ORDER — LORAZEPAM 0.5 MG PO TABS: ORAL_TABLET | ORAL | 0 refills | Status: DC

## 2019-03-07 NOTE — Telephone Encounter (Signed)
Antianxiety medications prescribed prior to MRI. Please inform patient.

## 2019-03-07 NOTE — Progress Notes (Signed)
Radiologist sees arthritis at the small joint at the top of the shoulder.  They call this severe.  They also see some arthritis at the main shoulder joint.  They see evidence of calcific tendinitis as I saw on ultrasound.  MRI will be very helpful here to further characterize this.

## 2019-03-07 NOTE — Telephone Encounter (Signed)
Called and LM for pt regarding Ativan rx and need for pt to have someone drive him to/from MRI.

## 2019-03-09 ENCOUNTER — Ambulatory Visit (INDEPENDENT_AMBULATORY_CARE_PROVIDER_SITE_OTHER): Payer: BC Managed Care – PPO

## 2019-03-09 ENCOUNTER — Other Ambulatory Visit: Payer: Self-pay

## 2019-03-09 DIAGNOSIS — M19012 Primary osteoarthritis, left shoulder: Secondary | ICD-10-CM | POA: Diagnosis not present

## 2019-03-09 DIAGNOSIS — M25512 Pain in left shoulder: Secondary | ICD-10-CM

## 2019-03-11 NOTE — Progress Notes (Signed)
MRI shoulder shows arthritis and bone irritation at the small [facility] joint at the top of the shoulder. Rotator cuff tendonitis and bursitis is present. No rotator cuff tear is visible.  At this point option are further injection or surgery.  Recommend return to clinic to go over the results in detail and discuss next steps.

## 2019-03-12 ENCOUNTER — Ambulatory Visit: Payer: Self-pay

## 2019-03-12 ENCOUNTER — Encounter: Payer: Self-pay | Admitting: Family Medicine

## 2019-03-12 ENCOUNTER — Ambulatory Visit (INDEPENDENT_AMBULATORY_CARE_PROVIDER_SITE_OTHER): Payer: BC Managed Care – PPO | Admitting: Family Medicine

## 2019-03-12 ENCOUNTER — Other Ambulatory Visit: Payer: Self-pay

## 2019-03-12 VITALS — BP 142/90 | HR 68 | Ht 73.0 in | Wt 218.0 lb

## 2019-03-12 DIAGNOSIS — M25512 Pain in left shoulder: Secondary | ICD-10-CM

## 2019-03-12 NOTE — Patient Instructions (Signed)
Thank you for coming in today. Call or go to the ER if you develop a large red swollen joint with extreme pain or oozing puss.   If this shot does not work well let me know.  Next step is discussion with a Psychologist, sport and exercise.   Keep me updated.

## 2019-03-12 NOTE — Progress Notes (Signed)
I, Wendy Poet, LAT, ATC, am serving as scribe for Dr. Lynne Leader.  Warren Hall. is a 54 y.o. male who presents to [place] at [facility] today for f/u of L shoulder pain and L shoulder MRI review performed on 03/09/19.  He was last seen by Dr. Georgina Snell on 03/06/19 after having con't L shoulder pain particularly w/ end-range shoulder flexion and quick reaching motions as well as when trying to lay on his L side.  He had a prior L subacromial injection on 12/24/18 w/ no relief.  Since his last visit, pt reports  Pertinent review of systems: No fevers or chills  Relevant historical information: History migraine.     Exam:  BP (!) 142/90 (BP Location: Left Arm, Patient Position: Sitting, Cuff Size: Normal)   Pulse 68   Ht 6\' 1"  (1.854 m)   Wt 218 lb (98.9 kg)   SpO2 97%   BMI 28.76 kg/m  General: Well Developed, well nourished, and in no acute distress.   MSK: Left shoulder: Normal-appearing normal motion mildly tender AC joint.    Lab and Radiology Results No results found for this or any previous visit (from the past 72 hour(s)). MR SHOULDER LEFT WO CONTRAST  Result Date: 03/10/2019 CLINICAL DATA:  Lateral left shoulder pain and limited range of motion for 3 months. No known injury. EXAM: MRI OF THE LEFT SHOULDER WITHOUT CONTRAST TECHNIQUE: Multiplanar, multisequence MR imaging of the shoulder was performed. No intravenous contrast was administered. COMPARISON:  Plain films left shoulder 03/06/2019. FINDINGS: Rotator cuff:  Intact.  Mild tendinopathy noted. Muscles:  Normal without atrophy or focal lesion. Biceps long head:  Intact. Acromioclavicular Joint: Fairly bulky degenerative change is present with marrow edema about the joint. Type 2 acromion. Small amount of subacromial/subdeltoid fluid is seen. Glenohumeral Joint: Appears normal. Labrum:  Intact. Bones:  No fracture, contusion or worrisome lesion. Other: None. 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 Mild rotator cuff tendinopathy without tear. Trace amount of fluid in the subacromial/subdeltoid bursa could be due to bursitis. Electronically Signed   By: Inge Rise M.D.   On: 03/10/2019 11:07    I, Lynne Leader, personally (independently) visualized and performed the interpretation of the images attached in this note.  Procedure: Real-time Ultrasound Guided Injection of left shoulder AC joint Device: Philips Affiniti 50G Images permanently stored and available for review in the ultrasound unit. Verbal informed consent obtained.  Discussed risks and benefits of procedure. Warned about infection bleeding damage to structures skin hypopigmentation and fat atrophy among others. Patient expresses understanding and agreement Time-out conducted.   Noted no overlying erythema, induration, or other signs of local infection.   Skin prepped in a sterile fashion.   Local anesthesia: Topical Ethyl chloride.   With sterile technique and under real time ultrasound guidance:  40 mg of Kenalog and 1 mL Marcaine (total volume 2 mL)  injected into the [facility] joint and deep through the [facility] joint into the subacromial space easily.   Completed without difficulty   Pain partially resolved suggesting accurate placement of the medication.   Advised to call if fevers/chills, erythema, induration, drainage, or persistent bleeding.   Images permanently stored and available for review in the ultrasound unit.  Impression: Technically successful ultrasound guided injection.       Assessment and Plan: 54 y.o. male with left shoulder pain.  Multifactorial however majority of pain most likely coming from [facility] joint based  on MRI as above.  Patient certainly does have a rotator cuff tendinopathy component.  Plan for [facility] injection as above today.  If not better neck step would be surgical.  Will refer to surgery if needed.  I had a discussion with patient  regarding MRI findings, next steps and what the surgery would look like (distal clavicle excision and subacromial decompression).  Patient expresses understanding and agreement.    Orders Placed This Encounter  Procedures  . [place] LIMITED JOINT SPACE STRUCTURES UP LEFT(NO LINKED CHARGES)    Order Specific Question:   Reason for Exam (SYMPTOM  OR DIAGNOSIS REQUIRED)    Answer:   AC injection    Order Specific Question:   Preferred imaging location?    Answer:   [place]   No orders of the defined types were placed in this encounter.    Discussed warning signs or symptoms. Please see discharge instructions. Patient expresses understanding.   The above documentation has been reviewed and is accurate and complete Lynne Leader

## 2019-03-18 ENCOUNTER — Telehealth: Payer: Self-pay | Admitting: Family Medicine

## 2019-03-18 DIAGNOSIS — M25512 Pain in left shoulder: Secondary | ICD-10-CM

## 2019-03-18 NOTE — Telephone Encounter (Signed)
Surgical referral placed today.  Please send copy of notes and MRI with referral.

## 2019-03-18 NOTE — Telephone Encounter (Signed)
Referral, OV notes and XR and MRI result notes have been faxed.  Called and informed pt that information has been sent to Dr. Veverly Fells at [facility] as requested.

## 2019-03-18 NOTE — Telephone Encounter (Signed)
Pt still having issues with shoulder, injections have not helped. Would like to ask if Dr. Georgina Snell would refer him to Dr. Veverly Fells at [facility] for a surgical consult.

## 2019-03-28 DIAGNOSIS — M7502 Adhesive capsulitis of left shoulder: Secondary | ICD-10-CM | POA: Diagnosis not present

## 2019-03-28 DIAGNOSIS — M25512 Pain in left shoulder: Secondary | ICD-10-CM | POA: Diagnosis not present

## 2019-04-02 ENCOUNTER — Ambulatory Visit: Payer: BC Managed Care – PPO | Attending: Orthopedic Surgery | Admitting: Physical Therapy

## 2019-04-02 ENCOUNTER — Other Ambulatory Visit: Payer: Self-pay

## 2019-04-02 DIAGNOSIS — M25612 Stiffness of left shoulder, not elsewhere classified: Secondary | ICD-10-CM

## 2019-04-02 DIAGNOSIS — M25512 Pain in left shoulder: Secondary | ICD-10-CM | POA: Insufficient documentation

## 2019-04-02 DIAGNOSIS — G8929 Other chronic pain: Secondary | ICD-10-CM | POA: Diagnosis not present

## 2019-04-02 NOTE — Patient Instructions (Signed)
Home Exercise Program Created by [place] Annaleese Guier Mar 2nd, 2021 View videos at [email or web address]   Total 3    ICE MASSAGE TO SUPRASPINATUS  Rest your arm on a table. Place direct ice from an ice massage cup to the side of your shoulder as shown. Move the ice in a circular motion for up to 5 minutes (no more). Use towels to catch the water drippings.   You should feel 4 stages of sensations starting with... 1. Uncomfortable sensation of cold, then 2. Stinging, then 3. Burning or aching feeling, then 4. Numbness  If the pain is too great to handle, lift it off your skin for a few seconds, dab with towel and then place it back on for a few circular motions and repeat.  Do not perform for more than 5 minutes or you may run the risk of frost bite and cause death to the tissue. Use a timer to be safe.   Video # [id number]  Duration 5 Minutes    Perform 1 Times a Day    Internal Rotation Towel Shoulder Stretch  Put towel over unaffected shoulder. Grab towel behind back with affected arm. Pull towel up and forward on unaffected side to achieve a stretch in the affected shoulder.  Repeat 3 Times    Hold 30 Seconds    Complete 2 Sets    Perform 2 Times     SIDELYING EXTERNAL ROTATION - ER  Lie on your side with your elbow bent and rested on your side. Next, draw up the your arm from the ground towards the ceiling.   Place a rolled up towel under your elbow if advised by your clinician.   Video # [id number]  Repeat 15 Times    Hold 1 Second    Complete 2 Sets    Perform 2 Times a Day

## 2019-04-02 NOTE — Therapy (Signed)
[facility] [place], [place], [address] Phone: [phone]   Fax:  [phone]  Physical Therapy Evaluation  Patient Details  Name: Warren Hall. MRN: [id number] Date of Birth: February 25, 1965 Referring Provider (PT): Esmond Plants MD   Encounter Date: 04/02/2019  PT End of Session - 04/02/19 1655    Visit Number  1    Number of Visits  8    Date for PT Re-Evaluation  04/30/19    Authorization Type  FOTO.    PT Start Time  0321    PT Stop Time  0409    PT Time Calculation (min)  48 min    Activity Tolerance  Patient tolerated treatment well    Behavior During Therapy  [facility] for tasks assessed/performed       Past Medical History:  Diagnosis Date  . Allergy    Seasonal  . Frequent headaches   . Hyperlipidemia   . Lumbar herniated disc 2001   conservative treatment  . Migraines   . Seasonal asthma     Past Surgical History:  Procedure Laterality Date  . ACHILLES TENDON REPAIR    . LASIK      There were no vitals filed for this visit.   Subjective Assessment - 04/02/19 1701    Subjective  COVID-19 screen performed prior to patient entering clinic.  The patient reports ongoing and worsening left shoulder pain over the last 5+ months.  Certain movements are very painful for the patient.  A recent injection from Dr. Veverly Fells was helpful and reduced his pain and improved his range of motion.    Pertinent History  Achilles tendon repair (2005), h/o neck and low back pain.         [facility] PT Assessment - 04/02/19 0001      Assessment   Medical Diagnosis  Adhesive capsulitis of left shoulder.    Referring Provider (PT)  Esmond Plants MD    Onset Date/Surgical Date  --   5+ months.     Precautions   Precautions  --   Avoid reproducing left shoulder pain.     Restrictions   Weight Bearing Restrictions  No      Balance Screen   Has the patient fallen in the past 6 months  No    Has the patient had a decrease  in activity level because of a fear of falling?   No    Is the patient reluctant to leave their home because of a fear of falling?   No      Home Environment   Living Environment  Private residence      Prior Function   Level of Independence  Independent      Observation/Other Assessments   Focus on Therapeutic Outcomes (FOTO)   49% limitation.      Deep Tendon Reflexes   DTR Assessment Site  Biceps;Brachioradialis;Triceps      ROM / Strength   AROM / PROM / Strength  AROM;Strength      AROM   Overall AROM Comments  Active left shoulder flexion to 145 degrees, ER= 80 degrees and behind back to left SIJ.      Strength   Overall Strength Comments  Essentially normal left shoulder strength.      Palpation   Palpation comment  Tender to palpation over left ACJ and acromial ridge.      Special Tests   Other special tests  Some pain reproduction with a Hawkins-Neer test.  Objective measurements completed on examination: See above findings.      [facility] Adult PT Treatment/Exercise - 04/02/19 0001      Modalities   Modalities  Electrical Stimulation;Ultrasound      Electrical Stimulation   Electrical Stimulation Location  Left shoulder acromial ridge.    Electrical Stimulation Action  Pre-mod.    Electrical Stimulation Parameters  80-150 Hz x 10 minutes.    Electrical Stimulation Goals  Pain      Ultrasound   Ultrasound Location  Left acromial ridge    Ultrasound Parameters  Combo e'stim/U/S at 1.50 W/CM2 x 8 minutes.    Ultrasound Goals  Pain                          Plan - 04/02/19 1725    Clinical Impression Statement  The patient presents to OPPT with c/o left shoulder pain that has been worsening over the last 5+ months.  A recent injection by Dr. Veverly Fells has been helpful.  He exhibits a loss of left shoulder flexion and IR.  His strength is normal.  He has palpable pain over his left ACJ and acromial ridge.  He had some pain  reproduction wiht a Hawkins-Neer test.Patient will benefit from skilled physical therapy intervention to address deficits and pain.    Personal Factors and Comorbidities  Comorbidity 1;Comorbidity 2    Comorbidities  Achilles tendon repair (2005), h/o neck and low back pain.    Examination-Activity Limitations  Reach Overhead    Examination-Participation Restrictions  Other    Stability/Clinical Decision Making  Evolving/Moderate complexity    Clinical Decision Making  Low    Rehab Potential  Excellent    PT Frequency  2x / week    PT Duration  4 weeks    PT Treatment/Interventions  ADLs/Self Care Home Management;Cryotherapy;Electrical Stimulation;Iontophoresis 4mg /ml Dexamethasone;Moist Heat;Ultrasound;Therapeutic exercise;Patient/family education;Manual techniques;Dry needling;Passive range of motion    PT Next Visit Plan  UBE, UE Ranger on wall.  RW4, SDLY ER, FULL CAN.  Towel stretch, Ant/post cap stretching.  Modalities as needed.    Consulted and Agree with Plan of Care  Patient       Patient will benefit from skilled therapeutic intervention in order to improve the following deficits and impairments:  Pain, Decreased activity tolerance, Decreased range of motion  Visit Diagnosis: Chronic left shoulder pain - Plan: PT plan of care cert/re-cert  Stiffness of left shoulder, not elsewhere classified - Plan: PT plan of care cert/re-cert     Problem List Patient Active Problem List   Diagnosis Date Noted  . Colon polyp 01/11/2017  . Vitamin D deficiency 12/08/2016  . Prostate cancer screening 12/03/2014  . Hyperlipidemia 12/03/2014  . Overweight (BMI 25.0-29.9) 05/13/2014  . Degeneration of lumbar or lumbosacral intervertebral disc 12/02/2013  . Lumbar spondylosis with myelopathy 11/27/2013  . Spondylosis, cervical, with myelopathy 11/27/2013  . Seasonal asthma 11/27/2013  . GERD (gastroesophageal reflux disease) 11/27/2013  . Migraine without aura and without status  migrainosus, not intractable 11/21/2013    Chicquita Mendel, [place] MPT 04/02/2019, 5:37 PM  [facility] [address] [place], [place], [address] Phone: [phone]   Fax:  [phone]  Name: Enki Masley. MRN: [id number] Date of Birth: March 19, 1965

## 2019-04-09 ENCOUNTER — Ambulatory Visit: Payer: BC Managed Care – PPO | Admitting: *Deleted

## 2019-04-09 DIAGNOSIS — M25612 Stiffness of left shoulder, not elsewhere classified: Secondary | ICD-10-CM

## 2019-04-09 DIAGNOSIS — M25512 Pain in left shoulder: Secondary | ICD-10-CM

## 2019-04-09 DIAGNOSIS — G8929 Other chronic pain: Secondary | ICD-10-CM

## 2019-04-09 NOTE — Therapy (Signed)
[facility] [place], [place], [address] Phone: [phone]   Fax:  [phone]  Physical Therapy Treatment  Patient Details  Name: Warren Hall. MRN: [id number] Date of Birth: February 03, 1965 Referring Provider (PT): Esmond Plants MD   Encounter Date: 04/09/2019  PT End of Session - 04/09/19 1759    Visit Number  2    Number of Visits  8    Date for PT Re-Evaluation  04/30/19    Authorization Type  FOTO.    PT Start Time  1645    PT Stop Time  1738    PT Time Calculation (min)  53 min       Past Medical History:  Diagnosis Date  . Allergy    Seasonal  . Frequent headaches   . Hyperlipidemia   . Lumbar herniated disc 2001   conservative treatment  . Migraines   . Seasonal asthma     Past Surgical History:  Procedure Laterality Date  . ACHILLES TENDON REPAIR    . LASIK      There were no vitals filed for this visit.  Subjective Assessment - 04/09/19 1654    Subjective  COVID-19 screen performed prior to patient entering clinic. Doing good today LT shld sore    Pertinent History  Achilles tendon repair (2005), h/o neck and low back pain.                       [facility] Adult PT Treatment/Exercise - 04/09/19 0001      Exercises   Exercises  Shoulder      Shoulder Exercises: Standing   External Rotation  Strengthening;Left;Theraband   2x fatigue   Theraband Level (Shoulder External Rotation)  Level 2 (Red)    Internal Rotation  Strengthening;Left;Theraband   2 x fatigue   Theraband Level (Shoulder Internal Rotation)  Level 2 (Red)      Shoulder Exercises: Pulleys   Flexion  3 minutes      Shoulder Exercises: ROM/Strengthening   UBE (Upper Arm Bike)  90 RPMs x 8 mins      Modalities   Modalities  Electrical Stimulation;Ultrasound;Vasopneumatic      Dance movement psychotherapist  Parameters  80-150hz x 15 mins    Electrical Stimulation Goals  Pain      Vasopneumatic   Number Minutes Vasopneumatic   15 minutes    Vasopnuematic Location   Shoulder    Vasopneumatic Pressure  Low    Vasopneumatic Temperature   36      Manual Therapy   Manual Therapy  Passive ROM    Passive ROM  PROM for ER                         Plan - 04/09/19 1702    Clinical Impression Statement  Pt arrived today doing fairly well with LT shldr and is mainly painful with stretching. He was guided through ROM and strengthening ex.'s for LT shldr with minimal c/o pain and mainly fatigue with ER/ IR strengthening.Normal modality response today.    Personal Factors and Comorbidities  Comorbidity 1;Comorbidity 2    Comorbidities  Achilles tendon repair (2005), h/o neck and low back pain.    Examination-Activity Limitations  Reach Overhead    Stability/Clinical Decision Making  Evolving/Moderate complexity    Rehab  Potential  Excellent    PT Frequency  2x / week    PT Duration  4 weeks    PT Treatment/Interventions  ADLs/Self Care Home Management;Cryotherapy;Electrical Stimulation;Iontophoresis 4mg /ml Dexamethasone;Moist Heat;Ultrasound;Therapeutic exercise;Patient/family education;Manual techniques;Dry needling;Passive range of motion    PT Next Visit Plan  UBE, UE Ranger on wall.  RW4, SDLY ER, FULL CAN.  Towel stretch, Ant/post cap stretching.  Modalities as needed.       Patient will benefit from skilled therapeutic intervention in order to improve the following deficits and impairments:  Pain, Decreased activity tolerance, Decreased range of motion  Visit Diagnosis: Chronic left shoulder pain  Stiffness of left shoulder, not elsewhere classified     Problem List Patient Active Problem List   Diagnosis Date Noted  . Colon polyp 01/11/2017  . Vitamin D deficiency 12/08/2016  . Prostate cancer screening 12/03/2014  . Hyperlipidemia 12/03/2014  . Overweight (BMI  25.0-29.9) 05/13/2014  . Degeneration of lumbar or lumbosacral intervertebral disc 12/02/2013  . Lumbar spondylosis with myelopathy 11/27/2013  . Spondylosis, cervical, with myelopathy 11/27/2013  . Seasonal asthma 11/27/2013  . GERD (gastroesophageal reflux disease) 11/27/2013  . Migraine without aura and without status migrainosus, not intractable 11/21/2013    Soma Bachand,CHRIS, PTA 04/09/2019, 6:13 PM  [facility] [address] [place], [place], [address] Phone: [phone]   Fax:  [phone]  Name: Warren Hall. MRN: [id number] Date of Birth: [birth year]

## 2019-10-12 ENCOUNTER — Other Ambulatory Visit: Payer: Self-pay | Admitting: Family Medicine

## 2019-10-12 DIAGNOSIS — G43009 Migraine without aura, not intractable, without status migrainosus: Secondary | ICD-10-CM

## 2019-10-14 NOTE — Telephone Encounter (Signed)
RF request for Sumatriptan 100mg   LOV: 01/18/2019 CPE  Next ov: Not scheduled  Last written: 01/17/2018 #15 x5 refills   [facility]

## 2019-12-11 DIAGNOSIS — M25512 Pain in left shoulder: Secondary | ICD-10-CM | POA: Diagnosis not present

## 2019-12-11 DIAGNOSIS — M542 Cervicalgia: Secondary | ICD-10-CM | POA: Diagnosis not present

## 2020-01-01 ENCOUNTER — Other Ambulatory Visit: Payer: Self-pay

## 2020-01-01 ENCOUNTER — Telehealth: Payer: Self-pay | Admitting: Family Medicine

## 2020-01-01 DIAGNOSIS — G43009 Migraine without aura, not intractable, without status migrainosus: Secondary | ICD-10-CM

## 2020-01-01 MED ORDER — SUMATRIPTAN SUCCINATE 100 MG PO TABS: ORAL_TABLET | ORAL | 0 refills | Status: DC

## 2020-01-01 NOTE — Telephone Encounter (Signed)
Patient was advised that an appointment was needed for further refills. Offered to schedule CPE or [facility] appt, CPE appt scheduled for 12/9. Short term supply given to last until appt time.

## 2020-01-01 NOTE — Telephone Encounter (Signed)
Patient called to request refill of sumatriptan. [facility] in [place].

## 2020-01-04 DIAGNOSIS — M542 Cervicalgia: Secondary | ICD-10-CM | POA: Diagnosis not present

## 2020-01-08 ENCOUNTER — Other Ambulatory Visit: Payer: Self-pay

## 2020-01-08 DIAGNOSIS — M47812 Spondylosis without myelopathy or radiculopathy, cervical region: Secondary | ICD-10-CM | POA: Diagnosis not present

## 2020-01-08 DIAGNOSIS — M5030 Other cervical disc degeneration, unspecified cervical region: Secondary | ICD-10-CM | POA: Diagnosis not present

## 2020-01-08 DIAGNOSIS — S43432D Superior glenoid labrum lesion of left shoulder, subsequent encounter: Secondary | ICD-10-CM | POA: Diagnosis not present

## 2020-01-09 ENCOUNTER — Encounter: Payer: Self-pay | Admitting: Family Medicine

## 2020-01-09 ENCOUNTER — Ambulatory Visit (INDEPENDENT_AMBULATORY_CARE_PROVIDER_SITE_OTHER): Payer: BC Managed Care – PPO | Admitting: Family Medicine

## 2020-01-09 VITALS — BP 130/84 | HR 55 | Temp 97.6°F | Ht 73.0 in | Wt 196.0 lb

## 2020-01-09 DIAGNOSIS — E785 Hyperlipidemia, unspecified: Secondary | ICD-10-CM | POA: Diagnosis not present

## 2020-01-09 DIAGNOSIS — K635 Polyp of colon: Secondary | ICD-10-CM | POA: Diagnosis not present

## 2020-01-09 DIAGNOSIS — Z0000 Encounter for general adult medical examination without abnormal findings: Secondary | ICD-10-CM

## 2020-01-09 DIAGNOSIS — Z1159 Encounter for screening for other viral diseases: Secondary | ICD-10-CM

## 2020-01-09 DIAGNOSIS — Z131 Encounter for screening for diabetes mellitus: Secondary | ICD-10-CM

## 2020-01-09 DIAGNOSIS — K219 Gastro-esophageal reflux disease without esophagitis: Secondary | ICD-10-CM | POA: Diagnosis not present

## 2020-01-09 DIAGNOSIS — Z125 Encounter for screening for malignant neoplasm of prostate: Secondary | ICD-10-CM

## 2020-01-09 DIAGNOSIS — G43009 Migraine without aura, not intractable, without status migrainosus: Secondary | ICD-10-CM | POA: Diagnosis not present

## 2020-01-09 DIAGNOSIS — E559 Vitamin D deficiency, unspecified: Secondary | ICD-10-CM

## 2020-01-09 LAB — COMPREHENSIVE METABOLIC PANEL
ALT: 14 U/L (ref 0–53)
AST: 18 U/L (ref 0–37)
Albumin: 4.7 g/dL (ref 3.5–5.2)
Alkaline Phosphatase: 48 U/L (ref 39–117)
BUN: 17 mg/dL (ref 6–23)
CO2: 28 mEq/L (ref 19–32)
Calcium: 9.9 mg/dL (ref 8.4–10.5)
Chloride: 102 mEq/L (ref 96–112)
Creatinine, Ser: 1.13 mg/dL (ref 0.40–1.50)
GFR: 74.24 mL/min (ref >60.00)
Glucose, Bld: 64 mg/dL — ABNORMAL LOW (ref 70–99)
Potassium: 4.5 mEq/L (ref 3.5–5.1)
Sodium: 138 mEq/L (ref 135–145)
Total Bilirubin: 0.6 mg/dL (ref 0.2–1.2)
Total Protein: 7 g/dL (ref 6.0–8.3)

## 2020-01-09 LAB — LIPID PANEL
Cholesterol: 242 mg/dL — ABNORMAL HIGH (ref 0–200)
HDL: 53.1 mg/dL (ref >39.00)
LDL Cholesterol: 160 mg/dL — ABNORMAL HIGH (ref 0–99)
NonHDL: 188.98
Total CHOL/HDL Ratio: 5
Triglycerides: 147 mg/dL (ref 0.0–149.0)
VLDL: 29.4 mg/dL (ref 0.0–40.0)

## 2020-01-09 LAB — TSH: TSH: 1.58 u[IU]/mL (ref 0.35–4.50)

## 2020-01-09 LAB — CBC WITH DIFFERENTIAL/PLATELET
Basophils Absolute: 0 10*3/uL (ref 0.0–0.1)
Basophils Relative: 0.4 % (ref 0.0–3.0)
Eosinophils Absolute: 0.1 10*3/uL (ref 0.0–0.7)
Eosinophils Relative: 1.8 % (ref 0.0–5.0)
HCT: 46.7 % (ref 39.0–52.0)
Hemoglobin: 15.9 g/dL (ref 13.0–17.0)
Lymphocytes Relative: 41.4 % (ref 12.0–46.0)
Lymphs Abs: 2.9 10*3/uL (ref 0.7–4.0)
MCHC: 33.9 g/dL (ref 30.0–36.0)
MCV: 94.8 fl (ref 78.0–100.0)
Monocytes Absolute: 0.6 10*3/uL (ref 0.1–1.0)
Monocytes Relative: 9.2 % (ref 3.0–12.0)
Neutro Abs: 3.3 10*3/uL (ref 1.4–7.7)
Neutrophils Relative %: 47.2 % (ref 43.0–77.0)
Platelets: 267 10*3/uL (ref 150.0–400.0)
RBC: 4.93 Mil/uL (ref 4.22–5.81)
RDW: 13.3 % (ref 11.5–15.5)
WBC: 6.9 10*3/uL (ref 4.0–10.5)

## 2020-01-09 LAB — HEMOGLOBIN A1C: Hgb A1c MFr Bld: 5.4 % (ref 4.6–6.5)

## 2020-01-09 LAB — VITAMIN D 25 HYDROXY (VIT D DEFICIENCY, FRACTURES): VITD: 35.18 ng/mL (ref 30.00–100.00)

## 2020-01-09 LAB — PSA: PSA: 1.14 ng/mL (ref 0.10–4.00)

## 2020-01-09 MED ORDER — ATORVASTATIN CALCIUM 80 MG PO TABS
80.0000 mg | ORAL_TABLET | Freq: Every day | ORAL | 3 refills | Status: DC
Start: 2020-01-09 — End: 2021-01-14

## 2020-01-09 MED ORDER — SUMATRIPTAN SUCCINATE 100 MG PO TABS: ORAL_TABLET | ORAL | 11 refills | Status: DC

## 2020-01-09 MED ORDER — OMEPRAZOLE 40 MG PO CPDR
40.0000 mg | DELAYED_RELEASE_CAPSULE | Freq: Every day | ORAL | 3 refills | Status: DC
Start: 2020-01-09 — End: 2021-01-14

## 2020-01-09 NOTE — Progress Notes (Signed)
This visit occurred during the SARS-CoV-2 public health emergency.  Safety protocols were in place, including screening questions prior to the visit, additional usage of staff PPE, and extensive cleaning of exam room while observing appropriate contact time as indicated for disinfecting solutions.    Patient ID: Unknown Foley., male  DOB: 1966-01-13, 54 y.o.   MRN: [id number] Patient Care Team    Relationship Specialty Notifications Start End  [name], DO PCP - General Family Medicine  09/29/14   Zonia Kief, MD  Rehabilitation  12/09/16   Yetta Flock, MD Consulting Physician Gastroenterology  01/17/18     Chief Complaint  Patient presents with  . Annual Exam    Pt is not fasting    Subjective: Warren Hall. is a 54 y.o. male present for [place]. All past medical history, surgical history, allergies, family history, immunizations, medications and social history were updated in the electronic medical record today. All recent labs, ED visits and hospitalizations within the last year were reviewed.  Health maintenance:  Colonoscopy: No FHX, completed 12/30/2016, Dr. Havery Moros, polyp present- 5 year recall. Due 2023 Immunizations: flu shot declined, td UTD2015, covid declined Infectious disease screening: HIV completed 2015- Hep c completed today PSA: collected today Lab Results  Component Value Date   PSA 1.97 01/17/2018   PSA 4.13 (H) 12/08/2016   PSA 0.79 12/04/2014  , pt was counseled on prostate cancer screenings.  Assistive device: none Oxygen JKD:TOIZ Patient has a Dental home. Hospitalizations/ED visits: reviewed  Hyperlipidemia:Patientreports he has not been taking last 2-3 months Lipitor 80 mg QD.  he has been eating better, exercising and taking fish oil.  GERD: Patient reports he is doing well on omeprazole 40 mg daily.  He has been unable to taper down on medication.  Vitamin D, B12 and mag was monitored 12/2017-all within normal    Migraines: using imitrex when needed. 1-2 a week migraine.   Depression screen [facility] 2/9 01/09/2020 01/17/2018 12/08/2016 12/07/2015  Decreased Interest 0 0 0 0  Down, Depressed, Hopeless 0 0 0 0  PHQ - 2 Score 0 0 0 0   No flowsheet data found.      Fall Risk  12/07/2015  Falls in the past year? No    Immunization History  Administered Date(s) Administered  . Influenza,inj,Quad PF,6+ Mos 11/27/2013, 10/21/2014, 12/07/2015, 12/08/2016, 01/17/2018  . Tdap 11/27/2013   Past Medical History:  Diagnosis Date  . Allergy    Seasonal  . Frequent headaches   . Hyperlipidemia   . Lumbar herniated disc 2001   conservative treatment  . Migraines   . Seasonal asthma    No Known Allergies Past Surgical History:  Procedure Laterality Date  . ACHILLES TENDON REPAIR    . LASIK     Family History  Problem Relation Age of Onset  . COPD Mother   . Hypertension Father   . Colon polyps Father   . COPD Maternal Grandmother   . Multiple sclerosis Maternal Grandfather   . Cancer Paternal Grandfather        lung  . Colon cancer Neg Hx   . Esophageal cancer Neg Hx   . Rectal cancer Neg Hx   . Stomach cancer Neg Hx    Social History   Social History Narrative   Mr Ledwith lives with his daughter. He works Medical laboratory scientific officer.    Allergies as of 01/09/2020   No Known Allergies     Medication List  Accurate as of January 09, 2020 10:57 AM. If you have any questions, ask your nurse or doctor.        STOP taking these medications   LORazepam 0.5 MG tablet Commonly known as: ATIVAN Stopped by: Howard Pouch, DO   naproxen 500 MG tablet Commonly known as: Naprosyn Stopped by: Howard Pouch, DO     TAKE these medications   albuterol 108 (90 Base) MCG/ACT inhaler Commonly known as: VENTOLIN HFA Inhale 2 puffs into the lungs every 4 (four) hours as needed for wheezing.   atorvastatin 80 MG tablet Commonly known as: LIPITOR Take 1 tablet (80 mg total) by mouth daily.   B-12 1000 MCG  Caps Take 1 tablet by mouth daily.   B-complex with vitamin C tablet Take 1 tablet by mouth daily.   cholecalciferol 25 MCG (1000 UNIT) tablet Commonly known as: VITAMIN D3 Take 1,000 Units by mouth daily.   Omega-3 1000 MG Caps Take 1,000 mg by mouth daily.   omeprazole 40 MG capsule Commonly known as: PRILOSEC Take 1 capsule (40 mg total) by mouth daily.   SUMAtriptan 100 MG tablet Commonly known as: IMITREX May repeat in 2 hours if headache persists or recurs.      All past medical history, surgical history, allergies, family history, immunizations andmedications were updated in the EMR today and reviewed under the history and medication portions of their EMR.     No results found for this or any previous visit (from the past 2160 hour(s)).  ROS: 14 pt review of systems performed and negative (unless mentioned in an HPI)  Objective: BP 130/84   Pulse (!) 55   Temp 97.6 F (36.4 C) (Oral)   Ht 6\' 1"  (1.854 m)   Wt 196 lb (88.9 kg)   SpO2 98%   BMI 25.86 kg/m  Gen: Afebrile. No acute distress. Nontoxic in appearance, well-developed, well-nourished,  Pleasant male.  HENT: AT. . Bilateral TM visualized and normal in appearance, normal external auditory canal. MMM, no oral lesions, adequate dentition. Bilateral nares within normal limits. Throat without erythema, ulcerations or exudates. no Cough on exam, no hoarseness on exam. Eyes:Pupils Equal Round Reactive to light, Extraocular movements intact,  Conjunctiva without redness, discharge or icterus. Neck/lymp/endocrine: Supple,no lymphadenopathy, no thyromegaly CV: RRR no murmur, no edema, +2/4 P posterior tibialis pulses.  Chest: CTAB, no wheeze, rhonchi or crackles. normal Respiratory effort. good Air movement. Abd: Soft. flat. NTND. BS present. no Masses palpated. No hepatosplenomegaly. No rebound tenderness or guarding. Skin: no rashes, purpura or petechiae. Warm and well-perfused. Skin intact. Neuro/Msk: Normal  gait. PERLA. EOMi. Alert. Oriented x3.  Cranial nerves II through XII intact. Muscle strength 5/5 upper/lower extremity. DTRs equal bilaterally. Psych: Normal affect, dress and demeanor. Normal speech. Normal thought content and judgment.  No exam data present  Assessment/plan: Ferrel [place]. is a 54 y.o. male present for CPE Gastroesophageal reflux disease, esophagitis presence not specified/long-term PPI/vitamin D deficiency/B12 deficiency - Stable murmur continue Gatorade in order to keep symptoms controlled -Continue prilosec 40 mg QD.  -Vitamin D, B12 and mag routine monitoring on long-term PPI. Vitamin D and B12 collected today May follow yearly with CPE  Hyperlipidemia, unspecified hyperlipidemia type/overweight - Dietary modifications  and exercise encouraged -Lipid panel collected today-patient has not been taking his atorvastatin for about 2 to 3 months -Continue fish oil supplementation -TSH, CBC and CMP collected today Follow-up yearly  Prostate cancer screening - PSA Need for hepatitis C screening tes - Hepatitis  C Antibody  Migraine without aura and without status migrainosus, not intractable Stable. Continue Imitrex as needed  Vitamin D deficiency - VITAMIN D 25 Hydroxy (Vit-D Deficiency, Fractures) Diabetes mellitus screening - Hemoglobin A1c  Encounter for preventive health examination Patient was encouraged to exercise greater than 150 minutes a week. Patient was encouraged to choose a diet filled with fresh fruits and vegetables, and lean meats. AVS provided to patient today for education/recommendation on gender specific health and safety maintenance. Colonoscopy: No FHX, completed 12/30/2016, Dr. Havery Moros, polyp present- 5 year recall. Due 2023 Immunizations: flu shot declined, td UTD2015, covid declined Infectious disease screening: HIV completed 2015- Hep c completed today Return in about 1 year (around 01/08/2021) for CPE (30 min).  Orders  Placed This Encounter  Procedures  . CBC with Differential/Platelet  . Comprehensive metabolic panel  . Hemoglobin A1c  . Lipid panel  . TSH  . PSA  . VITAMIN D 25 Hydroxy (Vit-D Deficiency, Fractures)  . Hepatitis C Antibody   Meds ordered this encounter  Medications  . SUMAtriptan (IMITREX) 100 MG tablet    Sig: May repeat in 2 hours if headache persists or recurs.    Dispense:  15 tablet    Refill:  11  . omeprazole (PRILOSEC) 40 MG capsule    Sig: Take 1 capsule (40 mg total) by mouth daily.    Dispense:  90 capsule    Refill:  3  . atorvastatin (LIPITOR) 80 MG tablet    Sig: Take 1 tablet (80 mg total) by mouth daily.    Dispense:  90 tablet    Refill:  3   Referral Orders  No referral(s) requested today     Note is dictated utilizing voice recognition software. Although note has been proof read prior to signing, occasional typographical errors still can be missed. If any questions arise, please do not hesitate to call for verification.  Electronically signed by: Howard Pouch, DO [place]

## 2020-01-09 NOTE — Patient Instructions (Signed)

## 2020-01-10 ENCOUNTER — Telehealth: Payer: Self-pay | Admitting: Family Medicine

## 2020-01-10 LAB — HEPATITIS C ANTIBODY
Hepatitis C Ab: NONREACTIVE
SIGNAL TO CUT-OFF: 0.01 (ref <1.00)

## 2020-01-10 NOTE — Telephone Encounter (Signed)
Patient returned call for lab results. Please call patient to advise.

## 2020-01-10 NOTE — Telephone Encounter (Signed)
Spoke with pt regarding labs and instructions.   

## 2020-01-13 DIAGNOSIS — M24112 Other articular cartilage disorders, left shoulder: Secondary | ICD-10-CM | POA: Diagnosis not present

## 2020-01-13 DIAGNOSIS — Y999 Unspecified external cause status: Secondary | ICD-10-CM | POA: Diagnosis not present

## 2020-01-13 DIAGNOSIS — X58XXXA Exposure to other specified factors, initial encounter: Secondary | ICD-10-CM | POA: Diagnosis not present

## 2020-01-13 DIAGNOSIS — M19012 Primary osteoarthritis, left shoulder: Secondary | ICD-10-CM | POA: Diagnosis not present

## 2020-01-13 DIAGNOSIS — S43432A Superior glenoid labrum lesion of left shoulder, initial encounter: Secondary | ICD-10-CM | POA: Diagnosis not present

## 2020-01-13 DIAGNOSIS — M7542 Impingement syndrome of left shoulder: Secondary | ICD-10-CM | POA: Diagnosis not present

## 2020-01-13 DIAGNOSIS — G8918 Other acute postprocedural pain: Secondary | ICD-10-CM | POA: Diagnosis not present

## 2020-01-16 ENCOUNTER — Ambulatory Visit: Payer: BLUE CROSS/BLUE SHIELD | Attending: Orthopedic Surgery | Admitting: Physical Therapy

## 2020-01-16 ENCOUNTER — Other Ambulatory Visit: Payer: Self-pay

## 2020-01-16 DIAGNOSIS — M25512 Pain in left shoulder: Secondary | ICD-10-CM

## 2020-01-16 DIAGNOSIS — G8929 Other chronic pain: Secondary | ICD-10-CM | POA: Insufficient documentation

## 2020-01-16 DIAGNOSIS — M25612 Stiffness of left shoulder, not elsewhere classified: Secondary | ICD-10-CM | POA: Diagnosis not present

## 2020-01-16 NOTE — Therapy (Signed)
[facility] [place], [place], [address] Phone: [phone]   Fax:  [phone]  Physical Therapy Evaluation  Patient Details  Name: Warren Hall. MRN: [id number] Date of Birth: Sep 11, 1965 Referring Provider (PT): Esmond Plants.   Encounter Date: 01/16/2020   PT End of Session - 01/16/20 1146    Visit Number 1    Number of Visits 12    Date for PT Re-Evaluation 02/27/20    Authorization Type FOTO.    PT Start Time 1106    PT Stop Time 1145    PT Time Calculation (min) 39 min    Activity Tolerance Patient tolerated treatment well    Behavior During Therapy WFL for tasks assessed/performed           Past Medical History:  Diagnosis Date  . Allergy    Seasonal  . Frequent headaches   . Hyperlipidemia   . Lumbar herniated disc 2001   conservative treatment  . Migraines   . Seasonal asthma     Past Surgical History:  Procedure Laterality Date  . ACHILLES TENDON REPAIR    . LASIK      There were no vitals filed for this visit.    Subjective Assessment - 01/16/20 1126    Subjective COVID-19 screen performed prior to patient entering clinic.  The patient presents to the clinic s/p left shoulder surgery performed on 01/13/20.  His resting pain-level is a 6/10 today but can go much higher with movement out of sling.  He will be removing his post-surgical dressing over the weekend.  He is complinat to using his sling and is doing the pendulum exercise at home.    Pertinent History Achilles tendon repair (2005), h/o neck and low back pain.    Patient Stated Goals Return normal function to left shoulder.    Currently in Pain? Yes    Pain Score 6     Pain Orientation Left    Pain Descriptors / Indicators Aching;Throbbing    Pain Onset In the past 7 days    Pain Frequency Constant    Aggravating Factors  See above.    Pain Relieving Factors See above.              [facility] PT Assessment - 01/16/20 0001       Assessment   Medical Diagnosis Pain of left shoulder joint.    Referring Provider (PT) Esmond Plants.    Onset Date/Surgical Date 01/13/20      Precautions   Precaution Comments Begin with PAROM to patient's left shoulder.      Restrictions   Other Position/Activity Restrictions No left UE weight bearing.      Balance Screen   Has the patient fallen in the past 6 months No    Has the patient had a decrease in activity level because of a fear of falling?  No    Is the patient reluctant to leave their home because of a fear of falling?  No      Home [employer] residence      Prior Function   Level of Independence Independent      ROM / Strength   AROM / PROM / Strength PROM      PROM   Overall PROM Comments In supine:  Left shoulder PAROM into flexion is 70 degrees and ER to neutral.      Palpation   Palpation comment Post-surgical dressing intact over patient's  left shoulder.  Patient currently c/o diffuse left shoulder pain.                      Objective measurements completed on examination: See above findings.       [facility] Adult PT Treatment/Exercise - 01/16/20 0001      Vasopneumatic   Number Minutes Vasopneumatic  17 minutes    Vasopnuematic Location  --   Left shoulder with pillow between thorax and left elbow.   Vasopneumatic Pressure Low                       PT Long Term Goals - 01/16/20 1155      PT LONG TERM GOAL #1   Title Independent with a HEP.    Time 4    Status New      PT LONG TERM GOAL #2   Title Active left shoulder flexion to 155 degrees so the patient can easily reach overhead.    Time 4    Period Weeks    Status New      PT LONG TERM GOAL #3   Title Active ER to 75-80 degrees+ to allow for easily donning/doffing of apparel.    Time 4    Period Weeks    Status New      PT LONG TERM GOAL #4   Title Increase ROM so patient is able to reach behind back to L3 with left hand.     Time 4    Period Weeks    Status Revised      PT LONG TERM GOAL #5   Title Increase left shoulder strength to a solid 4+/5 to increase stability for performance of functional activities.    Time 4      PT LONG TERM GOAL #6   Title Perform ADL's with left shoulder pain not > 3/10.    Time 6    Period Weeks    Status New                  Plan - 01/16/20 1148    Clinical Impression Statement The patient presents to OPPT s/p left shoulder surgery performed on 01/13/20.  He presented with his sling donned.  His post-surgical dressing is intact and he will be removing over the weekend.  His left shoulder ranage of motion is limited as expected and he has a significant amount of pain.  He is performing the pendulum exercise at home.Patient will benefit from skilled physical therapy intervention to address deficits and pain.    Personal Factors and Comorbidities Comorbidity 1;Comorbidity 2    Comorbidities Achilles tendon repair (2005), h/o neck and low back pain.    Examination-Activity Limitations Reach Overhead;Other    Examination-Participation Restrictions Other    Stability/Clinical Decision Making Stable/Uncomplicated    Clinical Decision Making Low    Rehab Potential Excellent    PT Frequency 2x / week    PT Duration 4 weeks    PT Treatment/Interventions ADLs/Self Care Home Management;Cryotherapy;Electrical Stimulation;Iontophoresis 4mg /ml Dexamethasone;Moist Heat;Ultrasound;Therapeutic exercise;Patient/family education;Manual techniques;Dry needling;Passive range of motion;Vasopneumatic Device    PT Next Visit Plan Begin with PAROM to patient's left shoulder.  Vasopnuematic with pillow between left elbow and side.    Consulted and Agree with Plan of Care Patient           Patient will benefit from skilled therapeutic intervention in order to improve the following deficits and impairments:  Pain,Decreased activity tolerance,Decreased range  of motion  Visit  Diagnosis: Chronic left shoulder pain - Plan: PT plan of care cert/re-cert  Stiffness of left shoulder, not elsewhere classified - Plan: PT plan of care cert/re-cert     Problem List Patient Active Problem List   Diagnosis Date Noted  . Colon polyp 01/11/2017  . Vitamin D deficiency 12/08/2016  . Prostate cancer screening 12/03/2014  . Hyperlipidemia with target LDL less than 130 12/03/2014  . Degeneration of lumbar or lumbosacral intervertebral disc 12/02/2013  . Lumbar spondylosis with myelopathy 11/27/2013  . Spondylosis, cervical, with myelopathy 11/27/2013  . Seasonal asthma 11/27/2013  . GERD (gastroesophageal reflux disease) 11/27/2013  . Migraine without aura and without status migrainosus, not intractable 11/21/2013    Celestine Bougie, [place] MPT 01/16/2020, 12:00 PM  [facility] [place], [place], [address] Phone: [phone]   Fax:  [phone]  Name: Kortney Schoenfelder. MRN: [id number] Date of Birth: [birth year]

## 2020-01-20 ENCOUNTER — Ambulatory Visit: Payer: BLUE CROSS/BLUE SHIELD | Admitting: *Deleted

## 2020-01-20 ENCOUNTER — Other Ambulatory Visit: Payer: Self-pay

## 2020-01-20 DIAGNOSIS — M25512 Pain in left shoulder: Secondary | ICD-10-CM | POA: Diagnosis not present

## 2020-01-20 DIAGNOSIS — M25612 Stiffness of left shoulder, not elsewhere classified: Secondary | ICD-10-CM

## 2020-01-20 DIAGNOSIS — G8929 Other chronic pain: Secondary | ICD-10-CM | POA: Diagnosis not present

## 2020-01-20 NOTE — Therapy (Signed)
[facility] [place], [place], [address] Phone: [phone]   Fax:  [phone]  Physical Therapy Treatment  Patient Details  Name: Warren Hall. MRN: [id number] Date of Birth: December 26, 1965 Referring Provider (PT): Esmond Plants.   Encounter Date: 01/20/2020   PT End of Session - 01/20/20 9169    Visit Number 2    Number of Visits 12    Date for PT Re-Evaluation 02/27/20    Authorization Type FOTO.    PT Start Time 0900    PT Stop Time 0950    PT Time Calculation (min) 50 min           Past Medical History:  Diagnosis Date  . Allergy    Seasonal  . Frequent headaches   . Hyperlipidemia   . Lumbar herniated disc 2001   conservative treatment  . Migraines   . Seasonal asthma     Past Surgical History:  Procedure Laterality Date  . ACHILLES TENDON REPAIR    . LASIK      There were no vitals filed for this visit.   Subjective Assessment - 01/20/20 0935    Subjective COVID-19 screen performed prior to patient entering clinic.  The patient presents to the clinic s/p left shoulder surgery performed on 01/13/20.  His resting pain-level is a 6/10 today but can go much higher with movement out of sling. Doing HEP    Pertinent History Achilles tendon repair (2005), h/o neck and low back pain.    Patient Stated Goals Return normal function to left shoulder.    Currently in Pain? Yes    Pain Score 6     Pain Location Shoulder    Pain Orientation Left    Pain Type Surgical pain    Pain Onset In the past 7 days    Pain Frequency Constant                             [facility] Adult PT Treatment/Exercise - 01/20/20 0001      Vasopneumatic   Number Minutes Vasopneumatic  15 minutes    Vasopnuematic Location  Shoulder    Vasopneumatic Pressure Low    Vasopneumatic Temperature  36 for edema/pain      Manual Therapy   Manual Therapy Passive ROM    Passive ROM PROM/ PAROM for LT shldr x 25 mins  for elevation and ER with oscillations to decrease pain and guarding in hooklying pos.                       PT Long Term Goals - 01/16/20 1155      PT LONG TERM GOAL #1   Title Independent with a HEP.    Time 4    Status New      PT LONG TERM GOAL #2   Title Active left shoulder flexion to 155 degrees so the patient can easily reach overhead.    Time 4    Period Weeks    Status New      PT LONG TERM GOAL #3   Title Active ER to 75-80 degrees+ to allow for easily donning/doffing of apparel.    Time 4    Period Weeks    Status New      PT LONG TERM GOAL #4   Title Increase ROM so patient is able to reach behind back to L3 with left hand.  Time 4    Period Weeks    Status Revised      PT LONG TERM GOAL #5   Title Increase left shoulder strength to a solid 4+/5 to increase stability for performance of functional activities.    Time 4      PT LONG TERM GOAL #6   Title Perform ADL's with left shoulder pain not > 3/10.    Time 6    Period Weeks    Status New                 Plan - 01/20/20 7741    Clinical Impression Statement Pt arrived today with sling donned. PROM/ PAROM to LT shldr for elevation and ER. ER to 30 degrees, and elevation to 80 degrees. Normal Vaso response today.    Personal Factors and Comorbidities Comorbidity 1;Comorbidity 2    Comorbidities Achilles tendon repair (2005), h/o neck and low back pain.    Examination-Participation Restrictions Other    Stability/Clinical Decision Making Stable/Uncomplicated    Rehab Potential Excellent    PT Frequency 2x / week    PT Duration 4 weeks    PT Treatment/Interventions ADLs/Self Care Home Management;Cryotherapy;Electrical Stimulation;Iontophoresis 4mg /ml Dexamethasone;Moist Heat;Ultrasound;Therapeutic exercise;Patient/family education;Manual techniques;Dry needling;Passive range of motion;Vasopneumatic Device    PT Next Visit Plan Begin with PAROM to patient's left shoulder.   Vasopnuematic with pillow between left elbow and side.    Consulted and Agree with Plan of Care Patient           Patient will benefit from skilled therapeutic intervention in order to improve the following deficits and impairments:  Pain,Decreased activity tolerance,Decreased range of motion  Visit Diagnosis: Chronic left shoulder pain  Stiffness of left shoulder, not elsewhere classified     Problem List Patient Active Problem List   Diagnosis Date Noted  . Colon polyp 01/11/2017  . Vitamin D deficiency 12/08/2016  . Prostate cancer screening 12/03/2014  . Hyperlipidemia with target LDL less than 130 12/03/2014  . Degeneration of lumbar or lumbosacral intervertebral disc 12/02/2013  . Lumbar spondylosis with myelopathy 11/27/2013  . Spondylosis, cervical, with myelopathy 11/27/2013  . Seasonal asthma 11/27/2013  . GERD (gastroesophageal reflux disease) 11/27/2013  . Migraine without aura and without status migrainosus, not intractable 11/21/2013    Keysi Oelkers,CHRIS, PTA 01/20/2020, 10:00 AM  [facility] [place], [place], [address] Phone: [phone]   Fax:  [phone]  Name: Warren Hall. MRN: [id number] Date of Birth: [birth year]

## 2020-01-23 ENCOUNTER — Encounter: Payer: Self-pay | Admitting: Physical Therapy

## 2020-01-23 ENCOUNTER — Other Ambulatory Visit: Payer: Self-pay

## 2020-01-23 ENCOUNTER — Ambulatory Visit: Payer: BLUE CROSS/BLUE SHIELD | Admitting: Physical Therapy

## 2020-01-23 DIAGNOSIS — M25512 Pain in left shoulder: Secondary | ICD-10-CM | POA: Diagnosis not present

## 2020-01-23 DIAGNOSIS — G8929 Other chronic pain: Secondary | ICD-10-CM | POA: Diagnosis not present

## 2020-01-23 DIAGNOSIS — M25612 Stiffness of left shoulder, not elsewhere classified: Secondary | ICD-10-CM

## 2020-01-23 NOTE — Therapy (Signed)
[facility] [address] [place], [place], [address] Phone: [phone]   Fax:  [phone]  Physical Therapy Treatment  Patient Details  Name: Warren Hall. MRN: [id number] Date of Birth: 1966/01/29 Referring Provider (PT): Malon Kindle.   Encounter Date: 01/23/2020   PT End of Session - 01/23/20 0950    Visit Number 3    Number of Visits 12    Date for PT Re-Evaluation 02/27/20    Authorization Type FOTO.    PT Start Time 0950    PT Stop Time 1033    PT Time Calculation (min) 43 min    Activity Tolerance Patient tolerated treatment well    Behavior During Therapy WFL for tasks assessed/performed           Past Medical History:  Diagnosis Date  . Allergy    Seasonal  . Frequent headaches   . Hyperlipidemia   . Lumbar herniated disc 2001   conservative treatment  . Migraines   . Seasonal asthma     Past Surgical History:  Procedure Laterality Date  . ACHILLES TENDON REPAIR    . LASIK      There were no vitals filed for this visit.   Subjective Assessment - 01/23/20 0949    Subjective COVID 19 screening performed on patient upon arrival. Patient reports light pain at rest.    Pertinent History Achilles tendon repair (2005), h/o neck and low back pain.    Patient Stated Goals Return normal function to left shoulder.    Currently in Pain? Yes    Pain Score 2     Pain Location Shoulder    Pain Orientation Left    Pain Descriptors / Indicators Aching;Throbbing;Sore    Pain Type Surgical pain    Pain Onset In the past 7 days    Pain Frequency Constant              [facility] PT Assessment - 01/23/20 0001      Assessment   Medical Diagnosis Pain of left shoulder joint.    Referring Provider (PT) Malon Kindle.    Onset Date/Surgical Date 01/13/20      Precautions   Precaution Comments Begin with PAROM to patient's left shoulder.      Restrictions   Other Position/Activity Restrictions No left UE weight  bearing.                         [facility] Adult PT Treatment/Exercise - 01/23/20 0001      Modalities   Modalities Vasopneumatic      Vasopneumatic   Number Minutes Vasopneumatic  15 minutes    Vasopnuematic Location  Shoulder    Vasopneumatic Pressure Low    Vasopneumatic Temperature  36 for edema/pain      Manual Therapy   Manual Therapy Passive ROM    Passive ROM PROM of L shoulder into flexion, ER, IR with gentle holds at end range and oscillations provided throughout                       PT Long Term Goals - 01/16/20 1155      PT LONG TERM GOAL #1   Title Independent with a HEP.    Time 4    Status New      PT LONG TERM GOAL #2   Title Active left shoulder flexion to 155 degrees so the patient can easily reach overhead.  Time 4    Period Weeks    Status New      PT LONG TERM GOAL #3   Title Active ER to 75-80 degrees+ to allow for easily donning/doffing of apparel.    Time 4    Period Weeks    Status New      PT LONG TERM GOAL #4   Title Increase ROM so patient is able to reach behind back to L3 with left hand.    Time 4    Period Weeks    Status Revised      PT LONG TERM GOAL #5   Title Increase left shoulder strength to a solid 4+/5 to increase stability for performance of functional activities.    Time 4      PT LONG TERM GOAL #6   Title Perform ADL's with left shoulder pain not > 3/10.    Time 6    Period Weeks    Status New                 Plan - 01/23/20 1055    Clinical Impression Statement Patient presented in clinic with reports of discomfort as he is only 1 week postop. Sling donned upon arrival. Patient required frequent oscillations during PROM to promote relaxation and reduce pain. Patient only taking ibuprofen at this time for pain control. Greater reports of discomfort with PROM flexion. Firm end feels and smooth arc of motion noted during PROM of L shoulder. Normal vasopneumatic response noted following  removal of the modality.    Personal Factors and Comorbidities Comorbidity 1;Comorbidity 2    Comorbidities Achilles tendon repair (2005), h/o neck and low back pain.    Examination-Activity Limitations Reach Overhead;Other    Examination-Participation Restrictions Other    Stability/Clinical Decision Making Stable/Uncomplicated    Rehab Potential Excellent    PT Frequency 2x / week    PT Duration 4 weeks    PT Treatment/Interventions ADLs/Self Care Home Management;Cryotherapy;Electrical Stimulation;Iontophoresis 4mg /ml Dexamethasone;Moist Heat;Ultrasound;Therapeutic exercise;Patient/family education;Manual techniques;Dry needling;Passive range of motion;Vasopneumatic Device    PT Next Visit Plan Begin with PAROM to patient's left shoulder.  Vasopnuematic with pillow between left elbow and side.    Consulted and Agree with Plan of Care Patient           Patient will benefit from skilled therapeutic intervention in order to improve the following deficits and impairments:  Pain,Decreased activity tolerance,Decreased range of motion  Visit Diagnosis: Chronic left shoulder pain  Stiffness of left shoulder, not elsewhere classified     Problem List Patient Active Problem List   Diagnosis Date Noted  . Colon polyp 01/11/2017  . Vitamin D deficiency 12/08/2016  . Prostate cancer screening 12/03/2014  . Hyperlipidemia with target LDL less than 130 12/03/2014  . Degeneration of lumbar or lumbosacral intervertebral disc 12/02/2013  . Lumbar spondylosis with myelopathy 11/27/2013  . Spondylosis, cervical, with myelopathy 11/27/2013  . Seasonal asthma 11/27/2013  . GERD (gastroesophageal reflux disease) 11/27/2013  . Migraine without aura and without status migrainosus, not intractable 11/21/2013    Standley Brooking, PTA 01/23/2020, 11:08 AM  [facility] [place], [place], [address] Phone: [phone]   Fax:   [phone]  Name: Warren Hall. MRN: [id number] Date of Birth: 1965/04/05

## 2020-01-27 ENCOUNTER — Ambulatory Visit: Payer: BLUE CROSS/BLUE SHIELD | Admitting: Physical Therapy

## 2020-01-27 ENCOUNTER — Other Ambulatory Visit: Payer: Self-pay

## 2020-01-27 DIAGNOSIS — G8929 Other chronic pain: Secondary | ICD-10-CM | POA: Diagnosis not present

## 2020-01-27 DIAGNOSIS — M25612 Stiffness of left shoulder, not elsewhere classified: Secondary | ICD-10-CM | POA: Diagnosis not present

## 2020-01-27 DIAGNOSIS — M25512 Pain in left shoulder: Secondary | ICD-10-CM | POA: Diagnosis not present

## 2020-01-27 NOTE — Therapy (Signed)
[facility] [address] [place], [place], [address] Phone: [phone]   Fax:  [phone]  Physical Therapy Treatment  Patient Details  Name: Asad Keeven. MRN: [id number] Date of Birth: 1965-02-12 Referring Provider (PT): Malon Kindle.   Encounter Date: 01/27/2020   PT End of Session - 01/27/20 0959    Visit Number 4    Number of Visits 12    Date for PT Re-Evaluation 02/27/20    Authorization Type FOTO.    PT Start Time 0900    PT Stop Time 0946    PT Time Calculation (min) 46 min    Activity Tolerance Patient tolerated treatment well    Behavior During Therapy WFL for tasks assessed/performed           Past Medical History:  Diagnosis Date  . Allergy    Seasonal  . Frequent headaches   . Hyperlipidemia   . Lumbar herniated disc 2001   conservative treatment  . Migraines   . Seasonal asthma     Past Surgical History:  Procedure Laterality Date  . ACHILLES TENDON REPAIR    . LASIK      There were no vitals filed for this visit.   Subjective Assessment - 01/27/20 0954    Subjective COVID-19 screen performed prior to patient entering clinic.  No new complaints.    Pertinent History Achilles tendon repair (2005), h/o neck and low back pain.    Patient Stated Goals Return normal function to left shoulder.                             [facility] Adult PT Treatment/Exercise - 01/27/20 0001      Vasopneumatic   Number Minutes Vasopneumatic  15 minutes    Vasopnuematic Location  --   Left shoulder with pillow between left elbow and thorax.   Vasopneumatic Pressure Low      Manual Therapy   Manual Therapy Passive ROM    Passive ROM PAROM x 23 minute to patient's left shoulder into flexion and ER.                       PT Long Term Goals - 01/16/20 1155      PT LONG TERM GOAL #1   Title Independent with a HEP.    Time 4    Status New      PT LONG TERM GOAL #2   Title Active  left shoulder flexion to 155 degrees so the patient can easily reach overhead.    Time 4    Period Weeks    Status New      PT LONG TERM GOAL #3   Title Active ER to 75-80 degrees+ to allow for easily donning/doffing of apparel.    Time 4    Period Weeks    Status New      PT LONG TERM GOAL #4   Title Increase ROM so patient is able to reach behind back to L3 with left hand.    Time 4    Period Weeks    Status Revised      PT LONG TERM GOAL #5   Title Increase left shoulder strength to a solid 4+/5 to increase stability for performance of functional activities.    Time 4      PT LONG TERM GOAL #6   Title Perform ADL's with left shoulder pain not > 3/10.  Time 6    Period Weeks    Status New                 Plan - 01/27/20 0957    Clinical Impression Statement Patient's left shoulder range of motion is  progressing nicely.    Personal Factors and Comorbidities Comorbidity 1;Comorbidity 2    Comorbidities Achilles tendon repair (2005), h/o neck and low back pain.    Examination-Activity Limitations Reach Overhead;Other    Examination-Participation Restrictions Other    Stability/Clinical Decision Making Stable/Uncomplicated    Rehab Potential Excellent    PT Frequency 2x / week    PT Duration 4 weeks    PT Treatment/Interventions ADLs/Self Care Home Management;Cryotherapy;Electrical Stimulation;Iontophoresis 4mg /ml Dexamethasone;Moist Heat;Ultrasound;Therapeutic exercise;Patient/family education;Manual techniques;Dry needling;Passive range of motion;Vasopneumatic Device           Patient will benefit from skilled therapeutic intervention in order to improve the following deficits and impairments:  Pain,Decreased activity tolerance,Decreased range of motion  Visit Diagnosis: Chronic left shoulder pain  Stiffness of left shoulder, not elsewhere classified     Problem List Patient Active Problem List   Diagnosis Date Noted  . Colon polyp 01/11/2017  .  Vitamin D deficiency 12/08/2016  . Prostate cancer screening 12/03/2014  . Hyperlipidemia with target LDL less than 130 12/03/2014  . Degeneration of lumbar or lumbosacral intervertebral disc 12/02/2013  . Lumbar spondylosis with myelopathy 11/27/2013  . Spondylosis, cervical, with myelopathy 11/27/2013  . Seasonal asthma 11/27/2013  . GERD (gastroesophageal reflux disease) 11/27/2013  . Migraine without aura and without status migrainosus, not intractable 11/21/2013    Maritsa Hunsucker, 11/23/2013 MPT 01/27/2020, 9:59 AM  [facility] [address] [place], [place], [place] Phone: [phone]   Fax:  [phone]  Name: Tank Difiore. MRN: [name] Date of Birth: October 11, 1965

## 2020-01-30 ENCOUNTER — Ambulatory Visit: Payer: BLUE CROSS/BLUE SHIELD | Admitting: Physical Therapy

## 2020-02-05 ENCOUNTER — Other Ambulatory Visit: Payer: Self-pay

## 2020-02-05 ENCOUNTER — Encounter: Payer: Self-pay | Admitting: Physical Therapy

## 2020-02-05 ENCOUNTER — Ambulatory Visit: Payer: BC Managed Care – PPO | Attending: Orthopedic Surgery | Admitting: Physical Therapy

## 2020-02-05 DIAGNOSIS — G8929 Other chronic pain: Secondary | ICD-10-CM | POA: Diagnosis not present

## 2020-02-05 DIAGNOSIS — M25512 Pain in left shoulder: Secondary | ICD-10-CM | POA: Diagnosis not present

## 2020-02-05 DIAGNOSIS — M25612 Stiffness of left shoulder, not elsewhere classified: Secondary | ICD-10-CM | POA: Diagnosis not present

## 2020-02-05 NOTE — Therapy (Signed)
[facility] [address] [place], [place], [address] Phone: [phone]   Fax:  [phone]  Physical Therapy Treatment  Patient Details  Name: Warren Hall. MRN: [id number] Date of Birth: December 10, 1965 Referring Provider (PT): Malon Kindle.   Encounter Date: 02/05/2020   PT End of Session - 02/05/20 0946    Visit Number 5    Number of Visits 12    Date for PT Re-Evaluation 02/27/20    Authorization Type FOTO.    PT Start Time [phone]    PT Stop Time 1025    PT Time Calculation (min) 36 min    Activity Tolerance Patient tolerated treatment well    Behavior During Therapy WFL for tasks assessed/performed           Past Medical History:  Diagnosis Date  . Allergy    Seasonal  . Frequent headaches   . Hyperlipidemia   . Lumbar herniated disc 2001   conservative treatment  . Migraines   . Seasonal asthma     Past Surgical History:  Procedure Laterality Date  . ACHILLES TENDON REPAIR    . LASIK      There were no vitals filed for this visit.   Subjective Assessment - 02/05/20 0945    Subjective COVID-19 screen performed prior to patient entering clinic.  No new complaints.    Pertinent History Achilles tendon repair (2005), h/o neck and low back pain.    Patient Stated Goals Return normal function to left shoulder.    Currently in Pain? Yes    Pain Score 2     Pain Location Shoulder    Pain Orientation Left    Pain Descriptors / Indicators Discomfort    Pain Type Surgical pain    Pain Onset 1 to 4 weeks ago    Pain Frequency Constant              [facility] PT Assessment - 02/05/20 0001      Assessment   Medical Diagnosis Pain of left shoulder joint.    Referring Provider (PT) Malon Kindle.    Onset Date/Surgical Date 01/13/20    Next MD Visit 02/25/2020      Precautions   Precaution Comments Begin with PAROM to patient's left shoulder.      Restrictions   Other Position/Activity Restrictions No left UE  weight bearing.                         [facility] Adult PT Treatment/Exercise - 02/05/20 0001      Modalities   Modalities Vasopneumatic      Vasopneumatic   Number Minutes Vasopneumatic  10 minutes    Vasopnuematic Location  Shoulder    Vasopneumatic Pressure Low    Vasopneumatic Temperature  36 for edema/pain      Manual Therapy   Manual Therapy Passive ROM    Passive ROM PROM of L shoulder into flexion, ER, IR with holds at end range                       PT Long Term Goals - 02/05/20 1016      PT LONG TERM GOAL #1   Title Independent with a HEP.    Time 4    Period Weeks    Status On-going      PT LONG TERM GOAL #2   Title Active left shoulder flexion to 155 degrees so the patient can  easily reach overhead.    Time 4    Period Weeks    Status On-going      PT LONG TERM GOAL #3   Title Active ER to 75-80 degrees+ to allow for easily donning/doffing of apparel.    Time 4    Period Weeks    Status On-going      PT LONG TERM GOAL #4   Title Increase ROM so patient is able to reach behind back to L3 with left hand.    Time 4    Period Weeks    Status On-going      PT LONG TERM GOAL #5   Title Increase left shoulder strength to a solid 4+/5 to increase stability for performance of functional activities.    Time 4    Status On-going      PT LONG TERM GOAL #6   Title Perform ADL's with left shoulder pain not > 3/10.    Time 6    Period Weeks    Status On-going                 Plan - 02/05/20 1017    Clinical Impression Statement Patient presented in clinic with reports of low level L shoulder pain. Patient compliant with sling use and was cautioned against opening doors or jars via surgeon due to bicep repair per patient report. Patient continues to report intermittant end range discomfort into flexion. Patient required intermittant LUE oscillations during PROM session especially with flexion due to discomfort and guarding.  Patient was less guarded with PROM IR/ER. Normal vasopneumatic response noted following removal of the modality.    Personal Factors and Comorbidities Comorbidity 1;Comorbidity 2    Comorbidities Achilles tendon repair (2005), h/o neck and low back pain.    Examination-Activity Limitations Reach Overhead;Other    Examination-Participation Restrictions Other    Stability/Clinical Decision Making Stable/Uncomplicated    Rehab Potential Excellent    PT Frequency 2x / week    PT Duration 4 weeks    PT Treatment/Interventions ADLs/Self Care Home Management;Cryotherapy;Electrical Stimulation;Iontophoresis 4mg /ml Dexamethasone;Moist Heat;Ultrasound;Therapeutic exercise;Patient/family education;Manual techniques;Dry needling;Passive range of motion;Vasopneumatic Device    PT Next Visit Plan Begin with PAROM to patient's left shoulder.  Vasopnuematic with pillow between left elbow and side. Provide HEP for counter stretch.    Consulted and Agree with Plan of Care Patient           Patient will benefit from skilled therapeutic intervention in order to improve the following deficits and impairments:  Pain,Decreased activity tolerance,Decreased range of motion  Visit Diagnosis: Chronic left shoulder pain  Stiffness of left shoulder, not elsewhere classified     Problem List Patient Active Problem List   Diagnosis Date Noted  . Colon polyp 01/11/2017  . Vitamin D deficiency 12/08/2016  . Prostate cancer screening 12/03/2014  . Hyperlipidemia with target LDL less than 130 12/03/2014  . Degeneration of lumbar or lumbosacral intervertebral disc 12/02/2013  . Lumbar spondylosis with myelopathy 11/27/2013  . Spondylosis, cervical, with myelopathy 11/27/2013  . Seasonal asthma 11/27/2013  . GERD (gastroesophageal reflux disease) 11/27/2013  . Migraine without aura and without status migrainosus, not intractable 11/21/2013    Standley Brooking, PTA 02/05/2020, 10:26 AM  [facility] [place], [place], [address] Phone: [phone]   Fax:  [phone]  Name: Warren Hall. MRN: [id number] Date of Birth: 09-30-1965

## 2020-02-12 ENCOUNTER — Ambulatory Visit: Payer: BC Managed Care – PPO | Admitting: Physical Therapy

## 2020-02-12 ENCOUNTER — Other Ambulatory Visit: Payer: Self-pay

## 2020-02-12 DIAGNOSIS — G8929 Other chronic pain: Secondary | ICD-10-CM

## 2020-02-12 DIAGNOSIS — M25612 Stiffness of left shoulder, not elsewhere classified: Secondary | ICD-10-CM | POA: Diagnosis not present

## 2020-02-12 DIAGNOSIS — M25512 Pain in left shoulder: Secondary | ICD-10-CM | POA: Diagnosis not present

## 2020-02-12 NOTE — Therapy (Signed)
[facility] [place], [place], [address] Phone: [phone]   Fax:  [phone]  Physical Therapy Treatment  Patient Details  Name: Warren Hall. MRN: [id number] Date of Birth: [birth year] Referring Provider (PT): Esmond Plants.   Encounter Date: 02/12/2020   PT End of Session - 02/12/20 1224    Visit Number 6    Number of Visits 12    Date for PT Re-Evaluation 02/27/20    Authorization Type FOTO.    PT Start Time [phone]    PT Stop Time 1033    PT Time Calculation (min) 46 min    Activity Tolerance Patient tolerated treatment well    Behavior During Therapy WFL for tasks assessed/performed           Past Medical History:  Diagnosis Date  . Allergy    Seasonal  . Frequent headaches   . Hyperlipidemia   . Lumbar herniated disc 2001   conservative treatment  . Migraines   . Seasonal asthma     Past Surgical History:  Procedure Laterality Date  . ACHILLES TENDON REPAIR    . LASIK      There were no vitals filed for this visit.   Subjective Assessment - 02/12/20 1210    Subjective COVID-19 screen performed prior to patient entering clinic.  Doing well.    Pertinent History Achilles tendon repair (2005), h/o neck and low back pain.    Patient Stated Goals Return normal function to left shoulder.    Currently in Pain? Yes    Pain Location Shoulder    Pain Orientation Left    Pain Descriptors / Indicators Discomfort    Pain Type Surgical pain    Pain Onset 1 to 4 weeks ago              [facility] PT Assessment - 02/12/20 0001      PROM   Overall PROM Comments flexion is 120 and ER is 35 degrees.                         [facility] Adult PT Treatment/Exercise - 02/12/20 0001      Electrical Stimulation   Electrical Stimulation Action IFC (non-motoric)    Electrical Stimulation Parameters 80-150 Hz on 40% scan x 15 minutes.      Vasopneumatic   Number Minutes Vasopneumatic  15 minutes     Vasopnuematic Location  --   Left shoulder.   Vasopneumatic Pressure Low      Manual Therapy   Manual Therapy Passive ROM    Passive ROM In supine:  PAROM to patient's left shoulder into flexion and ER x 23 minutes.                       PT Long Term Goals - 02/05/20 1016      PT LONG TERM GOAL #1   Title Independent with a HEP.    Time 4    Period Weeks    Status On-going      PT LONG TERM GOAL #2   Title Active left shoulder flexion to 155 degrees so the patient can easily reach overhead.    Time 4    Period Weeks    Status On-going      PT LONG TERM GOAL #3   Title Active ER to 75-80 degrees+ to allow for easily donning/doffing of apparel.    Time 4  Period Weeks    Status On-going      PT LONG TERM GOAL #4   Title Increase ROM so patient is able to reach behind back to L3 with left hand.    Time 4    Period Weeks    Status On-going      PT LONG TERM GOAL #5   Title Increase left shoulder strength to a solid 4+/5 to increase stability for performance of functional activities.    Time 4    Status On-going      PT LONG TERM GOAL #6   Title Perform ADL's with left shoulder pain not > 3/10.    Time 6    Period Weeks    Status On-going                 Plan - 02/12/20 1227    Clinical Impression Statement Patient doiny well overall.  Left shoulder PAROM into flexion to 120 degrees and ER to 35 degrees.           Patient will benefit from skilled therapeutic intervention in order to improve the following deficits and impairments:     Visit Diagnosis: Chronic left shoulder pain  Stiffness of left shoulder, not elsewhere classified     Problem List Patient Active Problem List   Diagnosis Date Noted  . Colon polyp 01/11/2017  . Vitamin D deficiency 12/08/2016  . Prostate cancer screening 12/03/2014  . Hyperlipidemia with target LDL less than 130 12/03/2014  . Degeneration of lumbar or lumbosacral intervertebral disc 12/02/2013  .  Lumbar spondylosis with myelopathy 11/27/2013  . Spondylosis, cervical, with myelopathy 11/27/2013  . Seasonal asthma 11/27/2013  . GERD (gastroesophageal reflux disease) 11/27/2013  . Migraine without aura and without status migrainosus, not intractable 11/21/2013    [name], [place] MPT 02/12/2020, 12:29 PM  [facility] [address] [place], [place], [address] Phone: [phone]   Fax:  [phone]  Name: Warren Hall. MRN: [id number] Date of Birth: 03/30/1965

## 2020-02-19 ENCOUNTER — Other Ambulatory Visit: Payer: Self-pay

## 2020-02-19 ENCOUNTER — Ambulatory Visit: Payer: BC Managed Care – PPO | Admitting: Physical Therapy

## 2020-02-19 DIAGNOSIS — M25612 Stiffness of left shoulder, not elsewhere classified: Secondary | ICD-10-CM | POA: Diagnosis not present

## 2020-02-19 DIAGNOSIS — G8929 Other chronic pain: Secondary | ICD-10-CM | POA: Diagnosis not present

## 2020-02-19 DIAGNOSIS — M25512 Pain in left shoulder: Secondary | ICD-10-CM | POA: Diagnosis not present

## 2020-02-19 NOTE — Therapy (Signed)
[facility] [place], [place], [address] Phone: [phone]   Fax:  [phone]  Physical Therapy Treatment  Patient Details  Name: Warren Hall. MRN: [id number] Date of Birth: 01/08/1966 Referring Provider (PT): Esmond Plants.   Encounter Date: 02/19/2020   PT End of Session - 02/19/20 1403    Visit Number 7    Number of Visits 12    Date for PT Re-Evaluation 02/27/20    Authorization Type FOTO.    PT Start Time [phone]    PT Stop Time 1035    PT Time Calculation (min) 44 min    Activity Tolerance Patient tolerated treatment well    Behavior During Therapy WFL for tasks assessed/performed           Past Medical History:  Diagnosis Date  . Allergy    Seasonal  . Frequent headaches   . Hyperlipidemia   . Lumbar herniated disc 2001   conservative treatment  . Migraines   . Seasonal asthma     Past Surgical History:  Procedure Laterality Date  . ACHILLES TENDON REPAIR    . LASIK      There were no vitals filed for this visit.   Subjective Assessment - 02/19/20 1357    Subjective COVID-19 screen performed prior to patient entering clinic.  Doing good.    Pertinent History Achilles tendon repair (2005), h/o neck and low back pain.    Patient Stated Goals Return normal function to left shoulder.    Currently in Pain? Yes    Pain Score 2               [facility] PT Assessment - 02/19/20 0001      PROM   Overall PROM Comments Flexion to 132 degrees and ER to 48 degrees.                         [facility] Adult PT Treatment/Exercise - 02/19/20 0001      Electrical Stimulation   Electrical Stimulation Location Left shoulder.    Electrical Stimulation Action IFC (non-motoric)    Electrical Stimulation Parameters 80-150 Hz x 15 minutes on 40% scan.      Vasopneumatic   Number Minutes Vasopneumatic  15 minutes    Vasopnuematic Location  --   Left shoulder.   Vasopneumatic Pressure Low       Manual Therapy   Manual Therapy Passive ROM    Passive ROM In supine:  PAROM to patient's left shoulder into flexion and ER x 23 minutes.                       PT Long Term Goals - 02/05/20 1016      PT LONG TERM GOAL #1   Title Independent with a HEP.    Time 4    Period Weeks    Status On-going      PT LONG TERM GOAL #2   Title Active left shoulder flexion to 155 degrees so the patient can easily reach overhead.    Time 4    Period Weeks    Status On-going      PT LONG TERM GOAL #3   Title Active ER to 75-80 degrees+ to allow for easily donning/doffing of apparel.    Time 4    Period Weeks    Status On-going      PT LONG TERM GOAL #4   Title Increase  ROM so patient is able to reach behind back to L3 with left hand.    Time 4    Period Weeks    Status On-going      PT LONG TERM GOAL #5   Title Increase left shoulder strength to a solid 4+/5 to increase stability for performance of functional activities.    Time 4    Status On-going      PT LONG TERM GOAL #6   Title Perform ADL's with left shoulder pain not > 3/10.    Time 6    Period Weeks    Status On-going                 Plan - 02/19/20 1403    Clinical Impression Statement Patient's left shoulder range of motion continues to improve.    Personal Factors and Comorbidities Comorbidity 1;Comorbidity 2    Comorbidities Achilles tendon repair (2005), h/o neck and low back pain.    Examination-Activity Limitations Reach Overhead;Other    Examination-Participation Restrictions Other    Stability/Clinical Decision Making Stable/Uncomplicated    Rehab Potential Excellent    PT Frequency 2x / week    PT Duration 4 weeks    PT Treatment/Interventions ADLs/Self Care Home Management;Cryotherapy;Electrical Stimulation;Iontophoresis 4mg /ml Dexamethasone;Moist Heat;Ultrasound;Therapeutic exercise;Patient/family education;Manual techniques;Dry needling;Passive range of motion;Vasopneumatic Device    PT  Next Visit Plan Begin with PAROM to patient's left shoulder.  Vasopnuematic with pillow between left elbow and side. Provide HEP for counter stretch.    Consulted and Agree with Plan of Care Patient           Patient will benefit from skilled therapeutic intervention in order to improve the following deficits and impairments:  Pain,Decreased activity tolerance,Decreased range of motion  Visit Diagnosis: Chronic left shoulder pain  Stiffness of left shoulder, not elsewhere classified     Problem List Patient Active Problem List   Diagnosis Date Noted  . Colon polyp 01/11/2017  . Vitamin D deficiency 12/08/2016  . Prostate cancer screening 12/03/2014  . Hyperlipidemia with target LDL less than 130 12/03/2014  . Degeneration of lumbar or lumbosacral intervertebral disc 12/02/2013  . Lumbar spondylosis with myelopathy 11/27/2013  . Spondylosis, cervical, with myelopathy 11/27/2013  . Seasonal asthma 11/27/2013  . GERD (gastroesophageal reflux disease) 11/27/2013  . Migraine without aura and without status migrainosus, not intractable 11/21/2013    Warren Hall, [place] MPT 02/19/2020, 2:08 PM  [facility] [address] [place], [place], [address] Phone: [phone]   Fax:  [phone]  Name: Warren Hall. MRN: [id number] Date of Birth: Apr 20, 1965

## 2020-02-26 ENCOUNTER — Other Ambulatory Visit: Payer: Self-pay

## 2020-02-26 ENCOUNTER — Ambulatory Visit: Payer: BC Managed Care – PPO | Admitting: Physical Therapy

## 2020-02-26 DIAGNOSIS — M25612 Stiffness of left shoulder, not elsewhere classified: Secondary | ICD-10-CM | POA: Diagnosis not present

## 2020-02-26 DIAGNOSIS — G8929 Other chronic pain: Secondary | ICD-10-CM | POA: Diagnosis not present

## 2020-02-26 DIAGNOSIS — M25512 Pain in left shoulder: Secondary | ICD-10-CM | POA: Diagnosis not present

## 2020-02-26 NOTE — Therapy (Addendum)
[facility] [place], [place], [address] Phone: [phone]   Fax:  [phone]  Physical Therapy Treatment  Patient Details  Name: Warren Hall. MRN: [id number] Date of Birth: [birth year] Referring Provider (PT): Esmond Plants.   Encounter Date: 02/26/2020   PT End of Session - 02/26/20 1105    Visit Number 8    Number of Visits 20    Date for PT Re-Evaluation 03/26/20    Authorization Type FOTO.    PT Start Time [phone]    PT Stop Time 1040    PT Time Calculation (min) 52 min    Activity Tolerance Patient tolerated treatment well    Behavior During Therapy WFL for tasks assessed/performed           Past Medical History:  Diagnosis Date  . Allergy    Seasonal  . Frequent headaches   . Hyperlipidemia   . Lumbar herniated disc 2001   conservative treatment  . Migraines   . Seasonal asthma     Past Surgical History:  Procedure Laterality Date  . ACHILLES TENDON REPAIR    . LASIK      There were no vitals filed for this visit.   Subjective Assessment - 02/26/20 1034    Subjective COVID-19 screen performed prior to patient entering clinic.  Doctor pleased.  New order to progress.    Pertinent History Achilles tendon repair (2005), h/o neck and low back pain.    Patient Stated Goals Return normal function to left shoulder.    Currently in Pain? Yes    Pain Score 2     Pain Location Shoulder    Pain Orientation Left    Pain Descriptors / Indicators Discomfort    Pain Type Surgical pain    Pain Onset 1 to 4 weeks ago                             [facility] Adult PT Treatment/Exercise - 02/26/20 0001      Exercises   Exercises Shoulder      Shoulder Exercises: Pulleys   Flexion 5 minutes    Flexion Limitations Pulleys performed with some left elbow flexion.      Vasopneumatic   Number Minutes Vasopneumatic  20 minutes    Vasopnuematic Location  --   Left shoulder.   Vasopneumatic  Pressure Low   Pillow between elbow and thorax.     Manual Therapy   Manual Therapy Passive ROM    Passive ROM In supine:  PAROM x 18 minutes into left shoulder flexion and ER.                       PT Long Term Goals - 02/05/20 1016      PT LONG TERM GOAL #1   Title Independent with a HEP.    Time 4    Period Weeks    Status On-going      PT LONG TERM GOAL #2   Title Active left shoulder flexion to 155 degrees so the patient can easily reach overhead.    Time 4    Period Weeks    Status On-going      PT LONG TERM GOAL #3   Title Active ER to 75-80 degrees+ to allow for easily donning/doffing of apparel.    Time 4    Period Weeks    Status On-going  PT LONG TERM GOAL #4   Title Increase ROM so patient is able to reach behind back to L3 with left hand.    Time 4    Period Weeks    Status On-going      PT LONG TERM GOAL #5   Title Increase left shoulder strength to a solid 4+/5 to increase stability for performance of functional activities.    Time 4    Status On-going      PT LONG TERM GOAL #6   Title Perform ADL's with left shoulder pain not > 3/10.    Time 6    Period Weeks    Status On-going                 Plan - 02/26/20 1100    Clinical Impression Statement The patient did well with an updated program today including pulleys, countertop and cane exercise to increase left shoulder flexion.  His range of motion is unchanged since last week.  Patient also instructed in towel stretch (new order:  IR behind the back).    Personal Factors and Comorbidities Comorbidity 1;Comorbidity 2    Comorbidities Achilles tendon repair (2005), h/o neck and low back pain.    Examination-Activity Limitations Reach Overhead;Other    Examination-Participation Restrictions Other    Stability/Clinical Decision Making Stable/Uncomplicated    Rehab Potential Excellent    PT Frequency 2x / week    PT Duration 4 weeks    PT Treatment/Interventions ADLs/Self  Care Home Management;Cryotherapy;Electrical Stimulation;Iontophoresis 4mg /ml Dexamethasone;Moist Heat;Ultrasound;Therapeutic exercise;Patient/family education;Manual techniques;Dry needling;Passive range of motion;Vasopneumatic Device    PT Next Visit Plan Begin with PAROM to patient's left shoulder.  Vasopnuematic with pillow between left elbow and side. Provide HEP for counter stretch.    Consulted and Agree with Plan of Care Patient           Patient will benefit from skilled therapeutic intervention in order to improve the following deficits and impairments:  Pain,Decreased activity tolerance,Decreased range of motion  Visit Diagnosis: Chronic left shoulder pain - Plan: PT plan of care cert/re-cert  Stiffness of left shoulder, not elsewhere classified - Plan: PT plan of care cert/re-cert     Problem List Patient Active Problem List   Diagnosis Date Noted  . Colon polyp 01/11/2017  . Vitamin D deficiency 12/08/2016  . Prostate cancer screening 12/03/2014  . Hyperlipidemia with target LDL less than 130 12/03/2014  . Degeneration of lumbar or lumbosacral intervertebral disc 12/02/2013  . Lumbar spondylosis with myelopathy 11/27/2013  . Spondylosis, cervical, with myelopathy 11/27/2013  . Seasonal asthma 11/27/2013  . GERD (gastroesophageal reflux disease) 11/27/2013  . Migraine without aura and without status migrainosus, not intractable 11/21/2013    [name], [place] MPT 02/26/2020, 11:13 AM  [facility] [place], [place], [address] Phone: [phone]   Fax:  [phone]  Name: Warren Hall. MRN: [id number] Date of Birth: Sep 10, 1965

## 2020-03-04 ENCOUNTER — Ambulatory Visit: Payer: BC Managed Care – PPO | Attending: Orthopedic Surgery | Admitting: Physical Therapy

## 2020-03-04 ENCOUNTER — Other Ambulatory Visit: Payer: Self-pay

## 2020-03-04 ENCOUNTER — Encounter: Payer: Self-pay | Admitting: Physical Therapy

## 2020-03-04 DIAGNOSIS — M25612 Stiffness of left shoulder, not elsewhere classified: Secondary | ICD-10-CM | POA: Insufficient documentation

## 2020-03-04 DIAGNOSIS — G8929 Other chronic pain: Secondary | ICD-10-CM | POA: Insufficient documentation

## 2020-03-04 DIAGNOSIS — M25512 Pain in left shoulder: Secondary | ICD-10-CM | POA: Diagnosis not present

## 2020-03-04 NOTE — Therapy (Signed)
[facility] [place], [place], [address] Phone: [phone]   Fax:  [phone]  Physical Therapy Treatment  Patient Details  Name: Warren Hall. MRN: [id number] Date of Birth: Oct 24, 1965 Referring Provider (PT): Esmond Plants.   Encounter Date: 03/04/2020   PT End of Session - 03/04/20 1100    Visit Number 9    Number of Visits 20    Date for PT Re-Evaluation 03/26/20    Authorization Type FOTO.    PT Start Time [phone]    PT Stop Time 1037    PT Time Calculation (min) 50 min    Activity Tolerance Patient tolerated treatment well    Behavior During Therapy WFL for tasks assessed/performed           Past Medical History:  Diagnosis Date  . Allergy    Seasonal  . Frequent headaches   . Hyperlipidemia   . Lumbar herniated disc 2001   conservative treatment  . Migraines   . Seasonal asthma     Past Surgical History:  Procedure Laterality Date  . ACHILLES TENDON REPAIR    . LASIK      There were no vitals filed for this visit.       [facility] PT Assessment - 03/04/20 0001      PROM   Overall PROM Comments Flexion to 140 and ER to 54 degrees.                         [facility] Adult PT Treatment/Exercise - 03/04/20 0001      Shoulder Exercises: Standing   Other Standing Exercises Corner stretch x 2 minutes.      Shoulder Exercises: Pulleys   Flexion 5 minutes    Other Pulley Exercises Wall ladder x 4 minutes.      Modalities   Modalities Psychologist, educational Location LT    Electrical Stimulation Action IFC x 20 minutes.      Vasopneumatic   Number Minutes Vasopneumatic  20 minutes    Vasopneumatic Pressure Low      Manual Therapy   Manual Therapy Passive ROM    Passive ROM PAROM to patient's left shoulder x 13 minutes.                       PT Long Term Goals - 02/05/20 1016      PT LONG TERM  GOAL #1   Title Independent with a HEP.    Time 4    Period Weeks    Status On-going      PT LONG TERM GOAL #2   Title Active left shoulder flexion to 155 degrees so the patient can easily reach overhead.    Time 4    Period Weeks    Status On-going      PT LONG TERM GOAL #3   Title Active ER to 75-80 degrees+ to allow for easily donning/doffing of apparel.    Time 4    Period Weeks    Status On-going      PT LONG TERM GOAL #4   Title Increase ROM so patient is able to reach behind back to L3 with left hand.    Time 4    Period Weeks    Status On-going      PT LONG TERM GOAL #5   Title Increase left shoulder strength to a  solid 4+/5 to increase stability for performance of functional activities.    Time 4    Status On-going      PT LONG TERM GOAL #6   Title Perform ADL's with left shoulder pain not > 3/10.    Time 6    Period Weeks    Status On-going                 Plan - 03/04/20 1057    Clinical Impression Statement Patient pleased with progress and reporting increased functional use of his left UE with home activities.  In supine, left shoulder flexion to 140 degrees and ER to 54 degrees.    Personal Factors and Comorbidities Comorbidity 1;Comorbidity 2    Comorbidities Achilles tendon repair (2005), h/o neck and low back pain.    Examination-Activity Limitations Reach Overhead;Other    Examination-Participation Restrictions Other    Stability/Clinical Decision Making Stable/Uncomplicated    Rehab Potential Excellent    PT Frequency 2x / week    PT Duration 4 weeks    PT Treatment/Interventions ADLs/Self Care Home Management;Cryotherapy;Electrical Stimulation;Iontophoresis 4mg /ml Dexamethasone;Moist Heat;Ultrasound;Therapeutic exercise;Patient/family education;Manual techniques;Dry needling;Passive range of motion;Vasopneumatic Device    PT Next Visit Plan Begin with PAROM to patient's left shoulder.  Vasopnuematic with pillow between left elbow and side.  Provide HEP for counter stretch.    Consulted and Agree with Plan of Care Patient           Patient will benefit from skilled therapeutic intervention in order to improve the following deficits and impairments:  Pain,Decreased activity tolerance,Decreased range of motion  Visit Diagnosis: Chronic left shoulder pain  Stiffness of left shoulder, not elsewhere classified     Problem List Patient Active Problem List   Diagnosis Date Noted  . Colon polyp 01/11/2017  . Vitamin D deficiency 12/08/2016  . Prostate cancer screening 12/03/2014  . Hyperlipidemia with target LDL less than 130 12/03/2014  . Degeneration of lumbar or lumbosacral intervertebral disc 12/02/2013  . Lumbar spondylosis with myelopathy 11/27/2013  . Spondylosis, cervical, with myelopathy 11/27/2013  . Seasonal asthma 11/27/2013  . GERD (gastroesophageal reflux disease) 11/27/2013  . Migraine without aura and without status migrainosus, not intractable 11/21/2013    Valborg Friar, [place]  MPT 03/04/2020, 11:02 AM  [facility] [address] [place], [place], [address] Phone: [phone]   Fax:  [phone]  Name: Warren Hall. MRN: [id number] Date of Birth: Nov 20, 1965

## 2020-03-11 ENCOUNTER — Ambulatory Visit: Payer: BC Managed Care – PPO | Admitting: Physical Therapy

## 2020-03-11 ENCOUNTER — Encounter: Payer: Self-pay | Admitting: Physical Therapy

## 2020-03-11 ENCOUNTER — Other Ambulatory Visit: Payer: Self-pay

## 2020-03-11 DIAGNOSIS — M25612 Stiffness of left shoulder, not elsewhere classified: Secondary | ICD-10-CM

## 2020-03-11 DIAGNOSIS — G8929 Other chronic pain: Secondary | ICD-10-CM | POA: Diagnosis not present

## 2020-03-11 DIAGNOSIS — M25512 Pain in left shoulder: Secondary | ICD-10-CM | POA: Diagnosis not present

## 2020-03-11 NOTE — Therapy (Signed)
[facility] [place], [place], [address] Phone: [phone]   Fax:  [phone]  Physical Therapy Treatment  Patient Details  Name: Warren Hall. MRN: [id number] Date of Birth: April 16, 1965 Referring Provider (PT): Esmond Plants.   Encounter Date: 03/11/2020   PT End of Session - 03/11/20 0951    Visit Number 10    Number of Visits 20    Date for PT Re-Evaluation 03/26/20    Authorization Type FOTO.    PT Start Time [phone]    PT Stop Time 1036    PT Time Calculation (min) 46 min    Activity Tolerance Patient tolerated treatment well    Behavior During Therapy WFL for tasks assessed/performed           Past Medical History:  Diagnosis Date  . Allergy    Seasonal  . Frequent headaches   . Hyperlipidemia   . Lumbar herniated disc 2001   conservative treatment  . Migraines   . Seasonal asthma     Past Surgical History:  Procedure Laterality Date  . ACHILLES TENDON REPAIR    . LASIK      There were no vitals filed for this visit.   Subjective Assessment - 03/11/20 0951    Subjective COVID-19 screen performed prior to patient entering clinic.  Reports his shoulder has intermittant pain but noting improved ROM.    Pertinent History Achilles tendon repair (2005), h/o neck and low back pain.    Patient Stated Goals Return normal function to left shoulder.    Currently in Pain? Yes    Pain Score 3     Pain Location Shoulder    Pain Orientation Left    Pain Descriptors / Indicators Discomfort    Pain Type Surgical pain    Pain Onset More than a month ago    Pain Frequency Constant              [facility] PT Assessment - 03/11/20 0001      Assessment   Medical Diagnosis Pain of left shoulder joint.    Referring Provider (PT) Esmond Plants.    Onset Date/Surgical Date 01/13/20    Next MD Visit 04/08/2020                         [facility] Adult PT Treatment/Exercise - 03/11/20 0001      Shoulder  Exercises: Supine   Flexion AROM;Left;15 reps   lawnchair progression     Shoulder Exercises: Sidelying   External Rotation AROM;Left;20 reps      Shoulder Exercises: Standing   Internal Rotation AAROM;Both;20 reps    Extension AAROM;Both;20 reps    Other Standing Exercises L shoulder horizontal adduction AAROM x20 reps      Shoulder Exercises: Pulleys   Flexion 5 minutes    Other Pulley Exercises Wall ladder x8 reps; max at 33      Shoulder Exercises: ROM/Strengthening   Wall Wash flex/ CW/CCW circles x20 reps each      Shoulder Exercises: Isometric Strengthening   Flexion 5X5"    Extension 5X5"    External Rotation 5X5"    Internal Rotation 5X5"      Modalities   Modalities Vasopneumatic      Vasopneumatic   Number Minutes Vasopneumatic  10 minutes    Vasopnuematic Location  Shoulder    Vasopneumatic Pressure Low    Vasopneumatic Temperature  34/pain      Manual Therapy  Manual Therapy Passive ROM    Passive ROM PROM of L shoulder into flex, ER, IR with holds at end range                       PT Long Term Goals - 02/05/20 1016      PT LONG TERM GOAL #1   Title Independent with a HEP.    Time 4    Period Weeks    Status On-going      PT LONG TERM GOAL #2   Title Active left shoulder flexion to 155 degrees so the patient can easily reach overhead.    Time 4    Period Weeks    Status On-going      PT LONG TERM GOAL #3   Title Active ER to 75-80 degrees+ to allow for easily donning/doffing of apparel.    Time 4    Period Weeks    Status On-going      PT LONG TERM GOAL #4   Title Increase ROM so patient is able to reach behind back to L3 with left hand.    Time 4    Period Weeks    Status On-going      PT LONG TERM GOAL #5   Title Increase left shoulder strength to a solid 4+/5 to increase stability for performance of functional activities.    Time 4    Status On-going      PT LONG TERM GOAL #6   Title Perform ADL's with left shoulder  pain not > 3/10.    Time 6    Period Weeks    Status On-going                 Plan - 03/11/20 1059    Clinical Impression Statement Patient progressed through light AAROM/AROM along with isometric introduction as well. Patient demonstrating limitation with behind the back IR, ext motions at this time. Patient reported a stretch in L anterior shoulder and deltoids with behind the back stretching. No complaints of pain with isometric training. Firm end feels and smooth arc of motion noted during PROM of L shoulder with greater tightness noted with ER/IR end range. Normal vasopneumatic response noted following removal of the modality.    Personal Factors and Comorbidities Comorbidity 1;Comorbidity 2    Comorbidities Achilles tendon repair (2005), h/o neck and low back pain.    Examination-Activity Limitations Reach Overhead;Other    Examination-Participation Restrictions Other    Stability/Clinical Decision Making Stable/Uncomplicated    Rehab Potential Excellent    PT Frequency 2x / week    PT Duration 4 weeks    PT Treatment/Interventions ADLs/Self Care Home Management;Cryotherapy;Electrical Stimulation;Iontophoresis 4mg /ml Dexamethasone;Moist Heat;Ultrasound;Therapeutic exercise;Patient/family education;Manual techniques;Dry needling;Passive range of motion;Vasopneumatic Device    PT Next Visit Plan Progress AAROM/AROM and introduction to strengthening per protocol. FOTO at 11th visit please.    Consulted and Agree with Plan of Care Patient           Patient will benefit from skilled therapeutic intervention in order to improve the following deficits and impairments:  Pain,Decreased activity tolerance,Decreased range of motion  Visit Diagnosis: Chronic left shoulder pain  Stiffness of left shoulder, not elsewhere classified     Problem List Patient Active Problem List   Diagnosis Date Noted  . Colon polyp 01/11/2017  . Vitamin D deficiency 12/08/2016  . Prostate cancer  screening 12/03/2014  . Hyperlipidemia with target LDL less than 130 12/03/2014  . Degeneration of  lumbar or lumbosacral intervertebral disc 12/02/2013  . Lumbar spondylosis with myelopathy 11/27/2013  . Spondylosis, cervical, with myelopathy 11/27/2013  . Seasonal asthma 11/27/2013  . GERD (gastroesophageal reflux disease) 11/27/2013  . Migraine without aura and without status migrainosus, not intractable 11/21/2013    Standley Brooking, PTA 03/11/2020, 11:04 AM  [facility] [place], [place], [address] Phone: [phone]   Fax:  [phone]  Name: Amaro Mangold. MRN: [id number] Date of Birth: 1965/03/18  Progress Note Reporting Period 01/16/20 to 03/11/20  See note below for Objective Data and Assessment of Progress/Goals. Very good progress per protocol with increasing left shoulder range of motion.    [place] Applegate MPT

## 2020-03-18 ENCOUNTER — Other Ambulatory Visit: Payer: Self-pay

## 2020-03-18 ENCOUNTER — Ambulatory Visit: Payer: BC Managed Care – PPO | Admitting: *Deleted

## 2020-03-18 DIAGNOSIS — M25612 Stiffness of left shoulder, not elsewhere classified: Secondary | ICD-10-CM

## 2020-03-18 DIAGNOSIS — G8929 Other chronic pain: Secondary | ICD-10-CM

## 2020-03-18 DIAGNOSIS — M25512 Pain in left shoulder: Secondary | ICD-10-CM | POA: Diagnosis not present

## 2020-03-18 NOTE — Therapy (Signed)
[facility] [place], [place], [address] Phone: [phone]   Fax:  [phone]  Physical Therapy Treatment  Patient Details  Name: Warren Hall. MRN: [id number] Date of Birth: November 30, 1965 Referring Provider (PT): Esmond Plants.   Encounter Date: 03/18/2020   PT End of Session - 03/18/20 1000    Visit Number 11    Number of Visits 20    Date for PT Re-Evaluation 03/26/20    Authorization Type FOTO.11th visit  61%    PT Start Time 0945    PT Stop Time 1035    PT Time Calculation (min) 50 min           Past Medical History:  Diagnosis Date  . Allergy    Seasonal  . Frequent headaches   . Hyperlipidemia   . Lumbar herniated disc 2001   conservative treatment  . Migraines   . Seasonal asthma     Past Surgical History:  Procedure Laterality Date  . ACHILLES TENDON REPAIR    . LASIK      There were no vitals filed for this visit.   Subjective Assessment - 03/18/20 0947    Subjective COVID-19 screen performed prior to patient entering clinic.  Reports his shoulder has intermittant pain but noting improved ROM. Pain 3/10    Pertinent History Achilles tendon repair (2005), h/o neck and low back pain.         01-13-20 LT shldr Labrum repair/ ACJ clean up    Patient Stated Goals Return normal function to left shoulder.    Currently in Pain? Yes    Pain Score 3     Pain Location Shoulder    Pain Orientation Left    Pain Descriptors / Indicators Discomfort    Pain Type Surgical pain    Pain Onset More than a month ago                             [facility] Adult PT Treatment/Exercise - 03/18/20 0001      Shoulder Exercises: Sidelying   External Rotation AROM;Left;20 reps;10 reps   3x10   Flexion AROM;Left;20 reps;10 reps   3x10   ABduction AROM;10 reps;20 reps   3x10     Shoulder Exercises: Standing   Extension AAROM;Both;20 reps    Other Standing Exercises L shoulder horizontal adduction  AAROM x20 reps      Shoulder Exercises: Pulleys   Flexion 5 minutes    Other Pulley Exercises Wall ladder x8 reps; max at 33      Shoulder Exercises: ROM/Strengthening   Wall Wash flex/ CW/CCW circles x20 reps each      Shoulder Exercises: Isometric Strengthening   Flexion 5X5"    Extension 5X5"    External Rotation 5X5"    Internal Rotation 5X5"      Modalities   Modalities Vasopneumatic      Vasopneumatic   Number Minutes Vasopneumatic  10 minutes    Vasopnuematic Location  Shoulder    Vasopneumatic Pressure Low    Vasopneumatic Temperature  34/pain                       PT Long Term Goals - 02/05/20 1016      PT LONG TERM GOAL #1   Title Independent with a HEP.    Time 4    Period Weeks    Status On-going  PT LONG TERM GOAL #2   Title Active left shoulder flexion to 155 degrees so the patient can easily reach overhead.    Time 4    Period Weeks    Status On-going      PT LONG TERM GOAL #3   Title Active ER to 75-80 degrees+ to allow for easily donning/doffing of apparel.    Time 4    Period Weeks    Status On-going      PT LONG TERM GOAL #4   Title Increase ROM so patient is able to reach behind back to L3 with left hand.    Time 4    Period Weeks    Status On-going      PT LONG TERM GOAL #5   Title Increase left shoulder strength to a solid 4+/5 to increase stability for performance of functional activities.    Time 4    Status On-going      PT LONG TERM GOAL #6   Title Perform ADL's with left shoulder pain not > 3/10.    Time 6    Period Weeks    Status On-going                 Plan - 03/18/20 1204    Clinical Impression Statement Pt arrived today doing fairly well, but still with pain LT shldr and ACJ. He did well with AAROM and AROM exs in standing and with added sidelying flexion and abduction. Normal VASO response    Personal Factors and Comorbidities Comorbidity 1;Comorbidity 2    Comorbidities Achilles tendon  repair (2005), h/o neck and low back pain.    Examination-Activity Limitations Reach Overhead;Other    Stability/Clinical Decision Making Stable/Uncomplicated    PT Frequency 2x / week    PT Duration 4 weeks    PT Treatment/Interventions ADLs/Self Care Home Management;Cryotherapy;Electrical Stimulation;Iontophoresis 4mg /ml Dexamethasone;Moist Heat;Ultrasound;Therapeutic exercise;Patient/family education;Manual techniques;Dry needling;Passive range of motion;Vasopneumatic Device    PT Next Visit Plan Progress AAROM/AROM and introduction to strengthening per protocol    Consulted and Agree with Plan of Care Patient           Patient will benefit from skilled therapeutic intervention in order to improve the following deficits and impairments:  Pain,Decreased activity tolerance,Decreased range of motion  Visit Diagnosis: Chronic left shoulder pain  Stiffness of left shoulder, not elsewhere classified     Problem List Patient Active Problem List   Diagnosis Date Noted  . Colon polyp 01/11/2017  . Vitamin D deficiency 12/08/2016  . Prostate cancer screening 12/03/2014  . Hyperlipidemia with target LDL less than 130 12/03/2014  . Degeneration of lumbar or lumbosacral intervertebral disc 12/02/2013  . Lumbar spondylosis with myelopathy 11/27/2013  . Spondylosis, cervical, with myelopathy 11/27/2013  . Seasonal asthma 11/27/2013  . GERD (gastroesophageal reflux disease) 11/27/2013  . Migraine without aura and without status migrainosus, not intractable 11/21/2013    RAMSEUR,CHRIS 03/18/2020, 12:20 PM  [facility] [address] [place], [place], [address] Phone: [phone]   Fax:  [phone]  Name: Tomaz Janis. MRN: [id number] Date of Birth: [birth year]

## 2020-03-18 NOTE — Therapy (Signed)
[facility] [place], [place], [address] Phone: [phone]   Fax:  [phone]  Physical Therapy Treatment  Patient Details  Name: Warren Hall. MRN: [id number] Date of Birth: 1965-05-07 Referring Provider (PT): Esmond Plants.   Encounter Date: 03/18/2020   PT End of Session - 03/18/20 1000    Visit Number 11    Number of Visits 20    Date for PT Re-Evaluation 03/26/20    Authorization Type FOTO.11th visit           Past Medical History:  Diagnosis Date  . Allergy    Seasonal  . Frequent headaches   . Hyperlipidemia   . Lumbar herniated disc 2001   conservative treatment  . Migraines   . Seasonal asthma     Past Surgical History:  Procedure Laterality Date  . ACHILLES TENDON REPAIR    . LASIK      There were no vitals filed for this visit.   Subjective Assessment - 03/18/20 0947    Subjective COVID-19 screen performed prior to patient entering clinic.  Reports his shoulder has intermittant pain but noting improved ROM. Pain 3/10    Pertinent History Achilles tendon repair (2005), h/o neck and low back pain.         01-13-20 LT shldr Labrum repair/ ACJ clean up    Patient Stated Goals Return normal function to left shoulder.    Currently in Pain? Yes    Pain Score 3     Pain Location Shoulder    Pain Orientation Left    Pain Descriptors / Indicators Discomfort    Pain Type Surgical pain    Pain Onset More than a month ago                             [facility] Adult PT Treatment/Exercise - 03/18/20 0001      Shoulder Exercises: Sidelying   External Rotation AROM;Left;20 reps;10 reps   3x10   Flexion AROM;Left;20 reps;10 reps   3x10   ABduction AROM;10 reps;20 reps   3x10     Shoulder Exercises: Standing   Extension AAROM;Both;20 reps    Other Standing Exercises L shoulder horizontal adduction AAROM x20 reps      Shoulder Exercises: Pulleys   Flexion 5 minutes    Other Pulley  Exercises Wall ladder x8 reps; max at 33      Shoulder Exercises: ROM/Strengthening   Wall Wash flex/ CW/CCW circles x20 reps each      Shoulder Exercises: Isometric Strengthening   Flexion 5X5"    Extension 5X5"    External Rotation 5X5"    Internal Rotation 5X5"      Modalities   Modalities Vasopneumatic      Vasopneumatic   Number Minutes Vasopneumatic  10 minutes    Vasopnuematic Location  Shoulder    Vasopneumatic Pressure Low    Vasopneumatic Temperature  34/pain                       PT Long Term Goals - 02/05/20 1016      PT LONG TERM GOAL #1   Title Independent with a HEP.    Time 4    Period Weeks    Status On-going      PT LONG TERM GOAL #2   Title Active left shoulder flexion to 155 degrees so the patient can easily reach overhead.  Time 4    Period Weeks    Status On-going      PT LONG TERM GOAL #3   Title Active ER to 75-80 degrees+ to allow for easily donning/doffing of apparel.    Time 4    Period Weeks    Status On-going      PT LONG TERM GOAL #4   Title Increase ROM so patient is able to reach behind back to L3 with left hand.    Time 4    Period Weeks    Status On-going      PT LONG TERM GOAL #5   Title Increase left shoulder strength to a solid 4+/5 to increase stability for performance of functional activities.    Time 4    Status On-going      PT LONG TERM GOAL #6   Title Perform ADL's with left shoulder pain not > 3/10.    Time 6    Period Weeks    Status On-going                 Plan - 03/18/20 1204    Clinical Impression Statement Pt arrived today doing fairly well, but still with pain LT shldr and ACJ. He did well with AAROM and AROM exs in standing and with added sidelying flexion and abduction. Normal VASO response    Personal Factors and Comorbidities Comorbidity 1;Comorbidity 2    Comorbidities Achilles tendon repair (2005), h/o neck and low back pain.    Examination-Activity Limitations Reach  Overhead;Other    Stability/Clinical Decision Making Stable/Uncomplicated    PT Frequency 2x / week    PT Duration 4 weeks    PT Treatment/Interventions ADLs/Self Care Home Management;Cryotherapy;Electrical Stimulation;Iontophoresis 4mg /ml Dexamethasone;Moist Heat;Ultrasound;Therapeutic exercise;Patient/family education;Manual techniques;Dry needling;Passive range of motion;Vasopneumatic Device    PT Next Visit Plan Progress AAROM/AROM and introduction to strengthening per protocol    Consulted and Agree with Plan of Care Patient           Patient will benefit from skilled therapeutic intervention in order to improve the following deficits and impairments:  Pain,Decreased activity tolerance,Decreased range of motion  Visit Diagnosis: Chronic left shoulder pain  Stiffness of left shoulder, not elsewhere classified     Problem List Patient Active Problem List   Diagnosis Date Noted  . Colon polyp 01/11/2017  . Vitamin D deficiency 12/08/2016  . Prostate cancer screening 12/03/2014  . Hyperlipidemia with target LDL less than 130 12/03/2014  . Degeneration of lumbar or lumbosacral intervertebral disc 12/02/2013  . Lumbar spondylosis with myelopathy 11/27/2013  . Spondylosis, cervical, with myelopathy 11/27/2013  . Seasonal asthma 11/27/2013  . GERD (gastroesophageal reflux disease) 11/27/2013  . Migraine without aura and without status migrainosus, not intractable 11/21/2013    Asya Derryberry,CHRIS, PTA 03/18/2020, 12:07 PM  [facility] [place], [place], [address] Phone: [phone]   Fax:  [phone]  Name: Coty Larsh. MRN: [id number] Date of Birth: 28-Jun-1965

## 2020-03-25 ENCOUNTER — Other Ambulatory Visit: Payer: Self-pay

## 2020-03-25 ENCOUNTER — Ambulatory Visit: Payer: BC Managed Care – PPO | Admitting: Physical Therapy

## 2020-03-25 DIAGNOSIS — M25612 Stiffness of left shoulder, not elsewhere classified: Secondary | ICD-10-CM | POA: Diagnosis not present

## 2020-03-25 DIAGNOSIS — M25512 Pain in left shoulder: Secondary | ICD-10-CM

## 2020-03-25 DIAGNOSIS — G8929 Other chronic pain: Secondary | ICD-10-CM | POA: Diagnosis not present

## 2020-03-25 NOTE — Therapy (Signed)
[facility] [place], [place], [address] Phone: [phone]   Fax:  [phone]  Physical Therapy Treatment  Patient Details  Name: Warren Hall. MRN: [id number] Date of Birth: 1965-06-26 Referring Provider (PT): Esmond Plants.   Encounter Date: 03/25/2020   PT End of Session - 03/25/20 1050    Visit Number 12    Number of Visits 20    Date for PT Re-Evaluation 03/26/20    Authorization Type FOTO.11th visit  61%    PT Start Time [phone]    PT Stop Time 1049    PT Time Calculation (min) 57 min    Activity Tolerance Patient tolerated treatment well    Behavior During Therapy WFL for tasks assessed/performed           Past Medical History:  Diagnosis Date  . Allergy    Seasonal  . Frequent headaches   . Hyperlipidemia   . Lumbar herniated disc 2001   conservative treatment  . Migraines   . Seasonal asthma     Past Surgical History:  Procedure Laterality Date  . ACHILLES TENDON REPAIR    . LASIK      There were no vitals filed for this visit.   Subjective Assessment - 03/25/20 1049    Subjective COVID-19 screen performed prior to patient entering clinic.  Patient pleased.  Reporting pain in left UT today.    Pertinent History Achilles tendon repair (2005), h/o neck and low back pain.         01-13-20 LT shldr Labrum repair/ ACJ clean up                             [facility] Adult PT Treatment/Exercise - 03/25/20 0001      Exercises   Exercises Shoulder      Shoulder Exercises: Standing   Other Standing Exercises UE ranger on wall x 5 minutes.    Other Standing Exercises Wall ladder x 4 minutes f/b corner strecth x 2 minutes.      Modalities   Modalities Ultrasound      Electrical Stimulation   Electrical Stimulation Location Left shoulder.    Electrical Stimulation Action IFC at 80-150 Hz    Electrical Stimulation Parameters 40% scan x 20 minutes.    Electrical Stimulation Goals  Pain      Ultrasound   Ultrasound Location Left UT    Ultrasound Parameters Combo e'stim/US with small soundhead at 1.50 W/CM2 x 12 minutes.      Manual Therapy   Manual Therapy Soft tissue mobilization    Soft tissue mobilization STW/M x 6 minutes to patient's left UT/Levator Scapulae.                       PT Long Term Goals - 02/05/20 1016      PT LONG TERM GOAL #1   Title Independent with a HEP.    Time 4    Period Weeks    Status On-going      PT LONG TERM GOAL #2   Title Active left shoulder flexion to 155 degrees so the patient can easily reach overhead.    Time 4    Period Weeks    Status On-going      PT LONG TERM GOAL #3   Title Active ER to 75-80 degrees+ to allow for easily donning/doffing of apparel.    Time 4  Period Weeks    Status On-going      PT LONG TERM GOAL #4   Title Increase ROM so patient is able to reach behind back to L3 with left hand.    Time 4    Period Weeks    Status On-going      PT LONG TERM GOAL #5   Title Increase left shoulder strength to a solid 4+/5 to increase stability for performance of functional activities.    Time 4    Status On-going      PT LONG TERM GOAL #6   Title Perform ADL's with left shoulder pain not > 3/10.    Time 6    Period Weeks    Status On-going                 Plan - 03/25/20 1053    Clinical Impression Statement Patient is progressing well with improving left shoulder range of motion.  He had a trigger point in his left UT which responded well to STW/M.    Personal Factors and Comorbidities Comorbidity 1;Comorbidity 2    Comorbidities Achilles tendon repair (2005), h/o neck and low back pain.    Examination-Activity Limitations Reach Overhead;Other    Examination-Participation Restrictions Other    Stability/Clinical Decision Making Stable/Uncomplicated    Rehab Potential Excellent    PT Frequency 2x / week    PT Duration 4 weeks    PT Treatment/Interventions ADLs/Self Care  Home Management;Cryotherapy;Electrical Stimulation;Iontophoresis 4mg /ml Dexamethasone;Moist Heat;Ultrasound;Therapeutic exercise;Patient/family education;Manual techniques;Dry needling;Passive range of motion;Vasopneumatic Device    PT Next Visit Plan Progress AAROM/AROM and introduction to strengthening per protocol    Consulted and Agree with Plan of Care Patient           Patient will benefit from skilled therapeutic intervention in order to improve the following deficits and impairments:  Pain,Decreased activity tolerance,Decreased range of motion  Visit Diagnosis: Chronic left shoulder pain  Stiffness of left shoulder, not elsewhere classified     Problem List Patient Active Problem List   Diagnosis Date Noted  . Colon polyp 01/11/2017  . Vitamin D deficiency 12/08/2016  . Prostate cancer screening 12/03/2014  . Hyperlipidemia with target LDL less than 130 12/03/2014  . Degeneration of lumbar or lumbosacral intervertebral disc 12/02/2013  . Lumbar spondylosis with myelopathy 11/27/2013  . Spondylosis, cervical, with myelopathy 11/27/2013  . Seasonal asthma 11/27/2013  . GERD (gastroesophageal reflux disease) 11/27/2013  . Migraine without aura and without status migrainosus, not intractable 11/21/2013    Sakina Briones, [place] 03/25/2020, 11:00 AM  [facility] [place], [place], [address] Phone: [phone]   Fax:  [phone]  Name: Warren Hall. MRN: [id number] Date of Birth: 08-Mar-1965

## 2020-03-27 DIAGNOSIS — R52 Pain, unspecified: Secondary | ICD-10-CM | POA: Diagnosis not present

## 2020-03-27 DIAGNOSIS — T148XXA Other injury of unspecified body region, initial encounter: Secondary | ICD-10-CM | POA: Diagnosis not present

## 2020-03-31 DIAGNOSIS — M5416 Radiculopathy, lumbar region: Secondary | ICD-10-CM | POA: Diagnosis not present

## 2020-04-01 ENCOUNTER — Ambulatory Visit: Payer: BC Managed Care – PPO | Attending: Orthopedic Surgery | Admitting: Physical Therapy

## 2020-04-01 ENCOUNTER — Other Ambulatory Visit: Payer: Self-pay

## 2020-04-01 ENCOUNTER — Encounter: Payer: Self-pay | Admitting: Physical Therapy

## 2020-04-01 DIAGNOSIS — M25612 Stiffness of left shoulder, not elsewhere classified: Secondary | ICD-10-CM | POA: Diagnosis not present

## 2020-04-01 DIAGNOSIS — G8929 Other chronic pain: Secondary | ICD-10-CM | POA: Diagnosis not present

## 2020-04-01 DIAGNOSIS — M25512 Pain in left shoulder: Secondary | ICD-10-CM | POA: Insufficient documentation
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
## 2020-04-06 ENCOUNTER — Ambulatory Visit: Payer: BC Managed Care – PPO | Attending: Specialist | Admitting: Physical Therapy

## 2020-04-06 ENCOUNTER — Other Ambulatory Visit: Payer: Self-pay

## 2020-04-06 DIAGNOSIS — M25612 Stiffness of left shoulder, not elsewhere classified: Secondary | ICD-10-CM | POA: Diagnosis not present

## 2020-04-06 DIAGNOSIS — G8929 Other chronic pain: Secondary | ICD-10-CM | POA: Diagnosis not present

## 2020-04-06 DIAGNOSIS — M545 Low back pain, unspecified: Secondary | ICD-10-CM | POA: Insufficient documentation

## 2020-04-06 DIAGNOSIS — M25512 Pain in left shoulder: Secondary | ICD-10-CM | POA: Insufficient documentation
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
## 2020-04-08 ENCOUNTER — Ambulatory Visit: Payer: BC Managed Care – PPO | Admitting: *Deleted

## 2020-04-08 ENCOUNTER — Other Ambulatory Visit: Payer: Self-pay

## 2020-04-08 DIAGNOSIS — M25512 Pain in left shoulder: Secondary | ICD-10-CM | POA: Diagnosis not present

## 2020-04-08 DIAGNOSIS — G8929 Other chronic pain: Secondary | ICD-10-CM

## 2020-04-08 DIAGNOSIS — M25612 Stiffness of left shoulder, not elsewhere classified: Secondary | ICD-10-CM

## 2020-04-08 NOTE — Therapy (Addendum)
[facility] [place], [place], [address] Phone: [phone]   Fax:  [phone]  Physical Therapy Treatment  Patient Details  Name: Warren Hall. MRN: [id number] Date of Birth: 1965-07-30 Referring Provider (PT): Susa Day MD   Encounter Date: 04/08/2020   PT End of Session - 04/08/20 0953    Visit Number 14    Number of Visits 20    Authorization Type 14th visit shldr FOTO 24%    PT Start Time 3846    PT Stop Time 6599    PT Time Calculation (min) 47 min           Past Medical History:  Diagnosis Date  . Allergy    Seasonal  . Frequent headaches   . Hyperlipidemia   . Lumbar herniated disc 2001   conservative treatment  . Migraines   . Seasonal asthma     Past Surgical History:  Procedure Laterality Date  . ACHILLES TENDON REPAIR    . LASIK      There were no vitals filed for this visit.   Subjective Assessment - 04/08/20 0954    Subjective COVID-19 screen performed prior to patient entering clinic DC for shldr today    Currently in Pain? Yes    Pain Score 2     Pain Location Shoulder    Pain Orientation Left    Pain Descriptors / Indicators Sore    Pain Type Surgical pain          HEP Performed and handout given:   Standing flexion  and abduction, bicep curls, tricep push downs, RW 4 with red tband,and wall push ups                               PT Long Term Goals - 04/08/20 1204      PT LONG TERM GOAL #1   Title Independent with a HEP.    Time 4    Status Achieved      PT LONG TERM GOAL #2   Title Active left shoulder flexion to 155 degrees so the patient can easily reach overhead.    Time 4    Period Weeks    Status Achieved      PT LONG TERM GOAL #3   Title Active ER to 75-80 degrees+ to allow for easily donning/doffing of apparel.    Time 4    Period Weeks    Status Partially Met   60 degrees     PT LONG TERM GOAL #4   Title Increase ROM so  patient is able to reach behind back to L3 with left hand.    Time 4    Period Weeks    Status Achieved      PT LONG TERM GOAL #5   Title Increase left shoulder strength to a solid 4+/5 to increase stability for performance of functional activities.    Time 4    Status Partially Met   4/5     PT LONG TERM GOAL #6   Title Perform ADL's with left shoulder pain not > 3/10.    Time 6    Period Weeks    Status Achieved                 Plan - 04/08/20 1205    Clinical Impression Statement Pt had f/u with MD and reports he can DC from PT at this time  to HEP. HEP performed and HEP handout given. All LTGs were met / partially met. Pt still with some ROM and strength deficits.    Personal Factors and Comorbidities Comorbidity 1;Comorbidity 2    Comorbidities Achilles tendon repair (2005), h/o neck and low back pain.    Examination-Activity Limitations Reach Overhead;Other    Examination-Participation Restrictions Other    Stability/Clinical Decision Making Stable/Uncomplicated    Rehab Potential Excellent    PT Frequency 2x / week    PT Duration 4 weeks    PT Treatment/Interventions ADLs/Self Care Home Management;Cryotherapy;Electrical Stimulation;Iontophoresis 39m/ml Dexamethasone;Moist Heat;Ultrasound;Therapeutic exercise;Patient/family education;Manual techniques;Dry needling;Passive range of motion;Vasopneumatic Device    PT [place]           Patient will benefit from skilled therapeutic intervention in order to improve the following deficits and impairments:  Pain,Decreased activity tolerance,Decreased range of motion  Visit Diagnosis: Chronic left shoulder pain  Stiffness of left shoulder, not elsewhere classified     Problem List Patient Active Problem List   Diagnosis Date Noted  . Colon polyp 01/11/2017  . Vitamin D deficiency 12/08/2016  . Prostate cancer screening 12/03/2014  .  Hyperlipidemia with target LDL less than 130 12/03/2014  . Degeneration of lumbar or lumbosacral intervertebral disc 12/02/2013  . Lumbar spondylosis with myelopathy 11/27/2013  . Spondylosis, cervical, with myelopathy 11/27/2013  . Seasonal asthma 11/27/2013  . GERD (gastroesophageal reflux disease) 11/27/2013  . Migraine without aura and without status migrainosus, not intractable 11/21/2013    Warren Hall,Warren Hall, Warren Hall 04/08/2020, 12:10 PM  [facility] [address]Phone: [phone]  Fax:  [phone] Name: [name] MRN: [id number] of Birth: 7Oct 22, 1967 PHYSICAL THERAPY DISCHARGE SUMMARY  Visits from Start of Care: 14.  Current functional level related to goals / functional outcomes: See above.   Remaining deficits: See goal section.   Education / Equipment: HEP. Plan: Patient agrees to discharge.  Patient goals were partially met.                                                  meeting the stated rehab goals.  ?????         CMaliApplegate MPT

## 2020-04-15 ENCOUNTER — Ambulatory Visit: Payer: BC Managed Care – PPO | Admitting: *Deleted

## 2020-04-15 ENCOUNTER — Other Ambulatory Visit: Payer: Self-pay

## 2020-04-15 ENCOUNTER — Encounter: Payer: Self-pay | Admitting: *Deleted

## 2020-04-15 DIAGNOSIS — M545 Low back pain, unspecified: Secondary | ICD-10-CM

## 2020-04-15 DIAGNOSIS — M25612 Stiffness of left shoulder, not elsewhere classified: Secondary | ICD-10-CM | POA: Diagnosis not present

## 2020-04-15 DIAGNOSIS — G8929 Other chronic pain: Secondary | ICD-10-CM | POA: Diagnosis not present

## 2020-04-15 DIAGNOSIS — M25512 Pain in left shoulder: Secondary | ICD-10-CM | POA: Diagnosis not present
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
## 2020-04-22 ENCOUNTER — Ambulatory Visit: Payer: BC Managed Care – PPO | Admitting: Physical Therapy

## 2020-04-22 ENCOUNTER — Other Ambulatory Visit: Payer: Self-pay

## 2020-04-22 DIAGNOSIS — G8929 Other chronic pain: Secondary | ICD-10-CM

## 2020-04-22 DIAGNOSIS — M545 Low back pain, unspecified: Secondary | ICD-10-CM | POA: Diagnosis not present

## 2020-04-22 DIAGNOSIS — M25512 Pain in left shoulder: Secondary | ICD-10-CM

## 2020-04-22 DIAGNOSIS — M25612 Stiffness of left shoulder, not elsewhere classified: Secondary | ICD-10-CM

## 2020-04-22 NOTE — Therapy (Addendum)
[facility] [place], [place], [address] Phone: [phone]   Fax:  [phone]  Physical Therapy Treatment  Patient Details  Name: Warren Hall. MRN: [id number] Date of Birth: 09-Jul-1965 Referring Provider (PT): Susa Day MD   Encounter Date: 04/22/2020   PT End of Session - 04/22/20 1257     Visit Number 4    Number of Visits 12    Date for PT Re-Evaluation 05/04/20    PT Start Time 0953    PT Stop Time 1041    PT Time Calculation (min) 48 min    Activity Tolerance Patient tolerated treatment well    Behavior During Therapy [facility] for tasks assessed/performed             Past Medical History:  Diagnosis Date   Allergy    Seasonal   Frequent headaches    Hyperlipidemia    Lumbar herniated disc 2001   conservative treatment   Migraines    Seasonal asthma     Past Surgical History:  Procedure Laterality Date   ACHILLES TENDON REPAIR     LASIK      There were no vitals filed for this visit.   Subjective Assessment - 04/22/20 1243     Subjective COVID-19 screen performed prior to patient entering clinic.  Back is doing better.    Pertinent History Achilles tendon repair (2005), h/o neck and low back pain (per patient his LBP in the past has alwayd been on the left side).         01-13-20 LT shldr Labrum repair/ ACJ clean up    Patient Stated Goals Get rid of right sided low back pain.    Currently in Pain? Yes    Pain Score 3     Pain Location Back    Pain Orientation Right    Pain Descriptors / Indicators Sore    Pain Type Surgical pain    Pain Onset 1 to 4 weeks ago                               [facility] Adult PT Treatment/Exercise - 04/22/20 0001       Exercises   Exercises Knee/Hip      Knee/Hip Exercises: Aerobic   Nustep Level 3 x 11 minutes.      Modalities   Modalities Electrical Stimulation;Moist Heat;Ultrasound      Moist Heat Therapy   Number Minutes  Moist Heat 15 Minutes    Moist Heat Location Lumbar Spine      Electrical Stimulation   Electrical Stimulation Location Rt SIJ/upper gluteal region.    Electrical Stimulation Action IFC    Electrical Stimulation Parameters 80-150 Hz x 15 minutes.    Electrical Stimulation Goals Pain      Ultrasound   Ultrasound Location Right SIJ/upper gluteal region.    Ultrasound Parameters Combo e'stim/US at 1.50 W/CM2 x 7 minutes.      Manual Therapy   Manual Therapy Soft tissue mobilization    Soft tissue mobilization STW/M x 5 minutes to patient's right SIJ/upper glut region x 5 minutes.                         PT Long Term Goals - 04/15/20 1320       PT LONG TERM GOAL #7   Title Ind with an HEP.    Time 4  Period Weeks    Status New      PT LONG TERM GOAL #8   Title Perform ADL's with no right sided LBP.    Time 4    Period Weeks    Status New      PT LONG TERM GOAL  #9   TITLE Normal right great toe extension strength.    Time 4    Period Weeks    Status New                   Plan - 04/22/20 1300     Clinical Impression Statement Patient doing well with treatment and beginning to report a lowered pain-level in his right low back though he still has tenderness over his right SIJ/upper gluteal region.    Personal Factors and Comorbidities Comorbidity 1;Comorbidity 2    Comorbidities Patient will benefit from skilled physical therapy intervention to address pain and deficits.    Examination-Activity Limitations Other;Bend    Examination-Participation Restrictions Other    Stability/Clinical Decision Making Stable/Uncomplicated    Rehab Potential Excellent    PT Frequency 3x / week    PT Duration 4 weeks    PT Treatment/Interventions ADLs/Self Care Home Management;Cryotherapy;Electrical Stimulation;Ultrasound;Traction;Moist Heat;Iontophoresis 32m/ml Dexamethasone;Therapeutic activities;Therapeutic exercise;Manual techniques;Patient/family  education;Passive range of motion;Dry needling;Joint Manipulations;Spinal Manipulations    PT Next Visit Plan Combo e'stim/US and STW/M over right SIJ/upper gluteal region.  Core exercise progression.    Consulted and Agree with Plan of Care Patient             Patient will benefit from skilled therapeutic intervention in order to improve the following deficits and impairments:  Pain,Decreased activity tolerance  Visit Diagnosis: Acute right-sided low back pain without sciatica  Chronic left shoulder pain  Stiffness of left shoulder, not elsewhere classified     Problem List Patient Active Problem List   Diagnosis Date Noted   Colon polyp 01/11/2017   Vitamin D deficiency 12/08/2016   Prostate cancer screening 12/03/2014   Hyperlipidemia with target LDL less than 130 12/03/2014   Degeneration of lumbar or lumbosacral intervertebral disc 12/02/2013   Lumbar spondylosis with myelopathy 11/27/2013   Spondylosis, cervical, with myelopathy 11/27/2013   Seasonal asthma 11/27/2013   GERD (gastroesophageal reflux disease) 11/27/2013   Migraine without aura and without status migrainosus, not intractable 11/21/2013   PHYSICAL THERAPY DISCHARGE SUMMARY  Visits from Start of Care: 4.  Current functional level related to goals / functional outcomes: See above.   Remaining deficits: Cont rt sided lBP   Education / Equipment: HEP.   Patient agrees to discharge. Patient goals were not met. Patient is being discharged due to not returning since the last visit.  Khaliya Golinski, CMali MPT 04/22/2020, 1:05 PM  [facility] [address]Phone: [phone]  Fax:  [phone] Name: Warren Hall MRN: [id number] of Birth: 705-12-1965

## 2020-07-01 ENCOUNTER — Encounter: Payer: Self-pay | Admitting: Family Medicine

## 2020-07-01 ENCOUNTER — Other Ambulatory Visit: Payer: Self-pay

## 2020-07-01 ENCOUNTER — Telehealth (INDEPENDENT_AMBULATORY_CARE_PROVIDER_SITE_OTHER): Payer: BC Managed Care – PPO | Admitting: Family Medicine

## 2020-07-01 ENCOUNTER — Ambulatory Visit (INDEPENDENT_AMBULATORY_CARE_PROVIDER_SITE_OTHER): Payer: BC Managed Care – PPO

## 2020-07-01 DIAGNOSIS — R059 Cough, unspecified: Secondary | ICD-10-CM

## 2020-07-01 DIAGNOSIS — J208 Acute bronchitis due to other specified organisms: Secondary | ICD-10-CM | POA: Diagnosis not present

## 2020-07-01 DIAGNOSIS — U071 COVID-19: Secondary | ICD-10-CM | POA: Diagnosis not present

## 2020-07-01 DIAGNOSIS — J45998 Other asthma: Secondary | ICD-10-CM

## 2020-07-01 HISTORY — DX: COVID-19: U07.1

## 2020-07-01 MED ORDER — DOXYCYCLINE HYCLATE 100 MG PO TABS: 100.0000 mg | ORAL_TABLET | Freq: Two times a day (BID) | ORAL | 0 refills | Status: DC

## 2020-07-01 MED ORDER — PREDNISONE 50 MG PO TABS: 50.0000 mg | ORAL_TABLET | Freq: Every day | ORAL | 0 refills | Status: DC

## 2020-07-01 MED ORDER — ALBUTEROL SULFATE HFA 108 (90 BASE) MCG/ACT IN AERS: 2.0000 | INHALATION_SPRAY | RESPIRATORY_TRACT | 1 refills | Status: DC | PRN

## 2020-07-01 MED ORDER — BENZONATATE 200 MG PO CAPS: 200.0000 mg | ORAL_CAPSULE | Freq: Two times a day (BID) | ORAL | 0 refills | Status: DC | PRN

## 2020-07-01 NOTE — Progress Notes (Signed)
VIRTUAL VISIT VIA VIDEO  I connected with Warren Hall. on 07/02/20 at  1:00 PM EDT by elemedicine application and verified that I am speaking with the correct person using two identifiers. Location patient: Home Location provider[facility] Persons participating in the virtual visit: Patient, Dr. Raoul Pitch and Darnell Level. Warren Hall, CMA  I discussed the limitations of evaluation and management by telemedicine and the availability of in person appointments. The patient expressed understanding and agreed to proceed.   SUBJECTIVE Chief Complaint  Patient presents with  . Covid Positive    Pt c/o nasal and chest congestion, cough,fatigue x 10 days; tested pos on 5/23    HPI: Warren Hall. is a 55 y.o. male present for COVID illness.  Patient reports he has nasal congestion, chest congestion, cough, fatigue, decreased taste sensation, headache and sinus pressure.  He has not had COVID vaccines.  He reports his wife tested positive for COVID on Saturday.  His symptoms then started abruptly on Monday 5/23.  His test was positive that same day. He has tried over-the-counter sinus medications to help with symptoms.  He reports he is going on 10 days of symptoms and he does not feel like it is improving much.  His cough is becoming mildly productive.  He denies shortness of breath.  ROS: See pertinent positives and negatives per HPI.  Patient Active Problem List   Diagnosis Date Noted  . COVID-19 07/01/2020  . Colon polyp 01/11/2017  . Vitamin D deficiency 12/08/2016  . Prostate cancer screening 12/03/2014  . Hyperlipidemia with target LDL less than 130 12/03/2014  . Degeneration of lumbar or lumbosacral intervertebral disc 12/02/2013  . Lumbar spondylosis with myelopathy 11/27/2013  . Spondylosis, cervical, with myelopathy 11/27/2013  . Seasonal asthma 11/27/2013  . GERD (gastroesophageal reflux disease) 11/27/2013  . Migraine without aura and without status migrainosus, not  intractable 11/21/2013    Social History   Tobacco Use  . Smoking status: Former Research scientist (life sciences)  . Smokeless tobacco: Never Used  . Tobacco comment: on and off 30 years ago  Substance Use Topics  . Alcohol use: Yes    Comment: weekly    Current Outpatient Medications:  .  atorvastatin (LIPITOR) 80 MG tablet, Take 1 tablet (80 mg total) by mouth daily., Disp: 90 tablet, Rfl: 3 .  B Complex-C (B-COMPLEX WITH VITAMIN C) tablet, Take 1 tablet by mouth daily., Disp: , Rfl:  .  benzonatate (TESSALON) 200 MG capsule, Take 1 capsule (200 mg total) by mouth 2 (two) times daily as needed for cough., Disp: 20 capsule, Rfl: 0 .  cholecalciferol (VITAMIN D3) 25 MCG (1000 UT) tablet, Take 1,000 Units by mouth daily., Disp: , Rfl:  .  Cyanocobalamin (B-12) 1000 MCG CAPS, Take 1 tablet by mouth daily., Disp: , Rfl:  .  doxycycline (VIBRA-TABS) 100 MG tablet, Take 1 tablet (100 mg total) by mouth 2 (two) times daily., Disp: 20 tablet, Rfl: 0 .  omeprazole (PRILOSEC) 40 MG capsule, Take 1 capsule (40 mg total) by mouth daily., Disp: 90 capsule, Rfl: 3 .  predniSONE (DELTASONE) 50 MG tablet, Take 1 tablet (50 mg total) by mouth daily with breakfast., Disp: 5 tablet, Rfl: 0 .  SUMAtriptan (IMITREX) 100 MG tablet, May repeat in 2 hours if headache persists or recurs., Disp: 15 tablet, Rfl: 11 .  albuterol (VENTOLIN HFA) 108 (90 Base) MCG/ACT inhaler, Inhale 2 puffs into the lungs every 4 (four) hours as needed for wheezing., Disp: 1  each, Rfl: 1  No Known Allergies  OBJECTIVE: There were no vitals taken for this visit. Gen: No acute distress. Nontoxic in appearance.  HENT: AT. [place].  MMM.  Eyes:Pupils Equal Round Reactive to light, Extraocular movements intact,  Conjunctiva without redness, discharge or icterus. Chest: Cough mild-present on exam.  No shortness of breath. Skin: No rashes, purpura or petechiae.  Neuro: Alert. Oriented x3   DG Chest 2 View  Result Date: 07/02/2020 CLINICAL DATA:  COVID positive  for 10 days. EXAM: CHEST - 2 VIEW COMPARISON:  08/02/2018. FINDINGS: The heart size and mediastinal contours are within normal limits. No consolidation. Similar biapical pleuroparenchymal scarring no visible pleural effusions or pneumothorax. No acute osseous abnormality. IMPRESSION: No active cardiopulmonary disease. Electronically Signed   By: Margaretha Sheffield MD   On: 07/02/2020 09:06    ASSESSMENT AND PLAN: Warren Sally. is a 55 y.o. male present for  Cough/seasonal asthma/COVID-19 bronchitis - DG Chest 2 View; Future> no active cardio or pulmonary disease. Rest, hydrate.  +/- flonase, mucinex (DM if cough), nettie pot or nasal saline.  Refilled albuterol inhaler to use every 4-6 hours 1 to 2 puffs as needed. Doxycycline prescribed, take until completed.  Tessalon Perles prescribed for cough. Prednisone taper for asthma exacerbation Self-isolation until symptoms are improving and fever free. Emergent precautions discussed. F/U 2 weeks if not improved.    Howard Pouch, DO 07/02/2020   No follow-ups on file.  Orders Placed This Encounter  Procedures  . DG Chest 2 View   Meds ordered this encounter  Medications  . doxycycline (VIBRA-TABS) 100 MG tablet    Sig: Take 1 tablet (100 mg total) by mouth 2 (two) times daily.    Dispense:  20 tablet    Refill:  0  . albuterol (VENTOLIN HFA) 108 (90 Base) MCG/ACT inhaler    Sig: Inhale 2 puffs into the lungs every 4 (four) hours as needed for wheezing.    Dispense:  1 each    Refill:  1  . benzonatate (TESSALON) 200 MG capsule    Sig: Take 1 capsule (200 mg total) by mouth 2 (two) times daily as needed for cough.    Dispense:  20 capsule    Refill:  0  . predniSONE (DELTASONE) 50 MG tablet    Sig: Take 1 tablet (50 mg total) by mouth daily with breakfast.    Dispense:  5 tablet    Refill:  0   Referral Orders  No referral(s) requested today

## 2020-07-02 ENCOUNTER — Encounter: Payer: Self-pay | Admitting: Family Medicine

## 2020-07-02 NOTE — Patient Instructions (Signed)

## 2020-09-28 DIAGNOSIS — M160 Bilateral primary osteoarthritis of hip: Secondary | ICD-10-CM | POA: Diagnosis not present

## 2020-09-28 DIAGNOSIS — M1611 Unilateral primary osteoarthritis, right hip: Secondary | ICD-10-CM | POA: Diagnosis not present

## 2020-10-28 DIAGNOSIS — M25551 Pain in right hip: Secondary | ICD-10-CM | POA: Diagnosis not present

## 2020-11-10 ENCOUNTER — Other Ambulatory Visit: Payer: Self-pay

## 2020-11-11 ENCOUNTER — Other Ambulatory Visit: Payer: Self-pay

## 2020-11-12 ENCOUNTER — Telehealth: Payer: Self-pay

## 2020-11-12 NOTE — Telephone Encounter (Signed)
Patient refill request.  [facility] [place]  SUMAtriptan (IMITREX) 100 MG tablet [[id number]]

## 2020-11-12 NOTE — Telephone Encounter (Signed)
Rx being filled by pharmacy

## 2021-01-15 ENCOUNTER — Encounter: Payer: Self-pay | Admitting: Family Medicine

## 2021-01-15 ENCOUNTER — Other Ambulatory Visit: Payer: Self-pay

## 2021-01-15 ENCOUNTER — Ambulatory Visit (INDEPENDENT_AMBULATORY_CARE_PROVIDER_SITE_OTHER): Payer: BC Managed Care – PPO | Admitting: Family Medicine

## 2021-01-15 VITALS — BP 135/78 | HR 72 | Temp 98.1°F | Ht 73.0 in | Wt 212.0 lb

## 2021-01-15 DIAGNOSIS — J45998 Other asthma: Secondary | ICD-10-CM | POA: Diagnosis not present

## 2021-01-15 DIAGNOSIS — G43009 Migraine without aura, not intractable, without status migrainosus: Secondary | ICD-10-CM | POA: Diagnosis not present

## 2021-01-15 DIAGNOSIS — E785 Hyperlipidemia, unspecified: Secondary | ICD-10-CM | POA: Diagnosis not present

## 2021-01-15 DIAGNOSIS — Z23 Encounter for immunization: Secondary | ICD-10-CM

## 2021-01-15 DIAGNOSIS — K219 Gastro-esophageal reflux disease without esophagitis: Secondary | ICD-10-CM | POA: Diagnosis not present

## 2021-01-15 LAB — COMPREHENSIVE METABOLIC PANEL
ALT: 19 U/L (ref 0–53)
AST: 20 U/L (ref 0–37)
Albumin: 4.5 g/dL (ref 3.5–5.2)
Alkaline Phosphatase: 52 U/L (ref 39–117)
BUN: 19 mg/dL (ref 6–23)
CO2: 27 mEq/L (ref 19–32)
Calcium: 9.7 mg/dL (ref 8.4–10.5)
Chloride: 103 mEq/L (ref 96–112)
Creatinine, Ser: 1.06 mg/dL (ref 0.40–1.50)
GFR: 79.59 mL/min (ref >60.00)
Glucose, Bld: 75 mg/dL (ref 70–99)
Potassium: 4 mEq/L (ref 3.5–5.1)
Sodium: 141 mEq/L (ref 135–145)
Total Bilirubin: 0.7 mg/dL (ref 0.2–1.2)
Total Protein: 6.8 g/dL (ref 6.0–8.3)

## 2021-01-15 LAB — LIPID PANEL
Cholesterol: 173 mg/dL (ref 0–200)
HDL: 49 mg/dL (ref >39.00)
LDL Cholesterol: 99 mg/dL (ref 0–99)
NonHDL: 124.45
Total CHOL/HDL Ratio: 4
Triglycerides: 127 mg/dL (ref 0.0–149.0)
VLDL: 25.4 mg/dL (ref 0.0–40.0)

## 2021-01-15 LAB — CBC
HCT: 42.7 % (ref 39.0–52.0)
Hemoglobin: 14.3 g/dL (ref 13.0–17.0)
MCHC: 33.5 g/dL (ref 30.0–36.0)
MCV: 95.7 fl (ref 78.0–100.0)
Platelets: 299 10*3/uL (ref 150.0–400.0)
RBC: 4.46 Mil/uL (ref 4.22–5.81)
RDW: 13 % (ref 11.5–15.5)
WBC: 7.8 10*3/uL (ref 4.0–10.5)

## 2021-01-15 LAB — TSH: TSH: 0.93 u[IU]/mL (ref 0.35–5.50)

## 2021-01-15 MED ORDER — ALBUTEROL SULFATE HFA 108 (90 BASE) MCG/ACT IN AERS: 2.0000 | INHALATION_SPRAY | RESPIRATORY_TRACT | 1 refills | Status: DC | PRN

## 2021-01-15 MED ORDER — ATORVASTATIN CALCIUM 80 MG PO TABS: 80.0000 mg | ORAL_TABLET | Freq: Every day | ORAL | 3 refills | Status: DC

## 2021-01-15 MED ORDER — SUMATRIPTAN SUCCINATE 100 MG PO TABS: ORAL_TABLET | ORAL | 11 refills | Status: DC

## 2021-01-15 MED ORDER — OMEPRAZOLE 40 MG PO CPDR: 40.0000 mg | DELAYED_RELEASE_CAPSULE | Freq: Every day | ORAL | 3 refills | Status: DC

## 2021-01-15 MED ORDER — ALBUTEROL SULFATE HFA 108 (90 BASE) MCG/ACT IN AERS: 2.0000 | INHALATION_SPRAY | RESPIRATORY_TRACT | 11 refills | Status: DC | PRN

## 2021-01-15 NOTE — Progress Notes (Signed)
This visit occurred during the SARS-CoV-2 public health emergency.  Safety protocols were in place, including screening questions prior to the visit, additional usage of staff PPE, and extensive cleaning of exam room while observing appropriate contact time as indicated for disinfecting solutions.    Patient ID: Warren Hall., male  DOB: [birth year], 55 y.o.   MRN: [id number] Patient Care Team    Relationship Specialty Notifications Start End  [name], DO PCP - General Family Medicine  09/29/14   Zonia Kief, MD  Rehabilitation  12/09/16   Yetta Flock, MD Consulting Physician Gastroenterology  01/17/18     Chief Complaint  Patient presents with   Hyperlipidemia    [place]; pt is not fasting    Subjective: Warren Crehan. is a 55 y.o. male present for [facility]. All past medical history, surgical history, allergies, family history, immunizations, medications and social history were updated in the electronic medical record today. All recent labs, ED visits and hospitalizations within the last year were reviewed.   Hyperlipidemia: Patient reports compliance lipitor 80 qd.   GERD: Patient reports he is feeling well on omeprazole 40 mg daily.  He has been unable to taper down on medication without return of symptoms.   Vitamin D, B12 and mag have been stable and he supplements vit d and b12.   Migraines: using imitrex when needed. 1-3 (1/2 tab) a week. This has been chronic for him.    Depression screen [facility] 2/9 01/15/2021 01/09/2020 01/17/2018 12/08/2016 12/07/2015  Decreased Interest 0 0 0 0 0  Down, Depressed, Hopeless 0 0 0 0 0  PHQ - 2 Score 0 0 0 0 0   No flowsheet data found.      Fall Risk  01/09/2020 12/07/2015  Falls in the past year? 0 No  Number falls in past yr: 0 -  Injury with Fall? 0 -  Follow up Falls evaluation completed -    Immunization History  Administered Date(s) Administered   Influenza,inj,Quad PF,6+ Mos 11/27/2013, 10/21/2014, 12/07/2015,  12/08/2016, 01/17/2018, 01/15/2021   Tdap 11/27/2013   Past Medical History:  Diagnosis Date   Allergy    Seasonal   Frequent headaches    Hyperlipidemia    Lumbar herniated disc 2001   conservative treatment   Migraines    Seasonal asthma    No Known Allergies Past Surgical History:  Procedure Laterality Date   ACHILLES TENDON REPAIR     LASIK     SHOULDER SURGERY     Family History  Problem Relation Age of Onset   COPD Mother    Hypertension Father    Colon polyps Father    COPD Maternal Grandmother    Multiple sclerosis Maternal Grandfather    Cancer Paternal Grandfather        lung   Colon cancer Neg Hx    Esophageal cancer Neg Hx    Rectal cancer Neg Hx    Stomach cancer Neg Hx    Social History   Social History Narrative   Mr Ewings lives with his daughter. He works Medical laboratory scientific officer.    Allergies as of 01/15/2021   No Known Allergies      Medication List        Accurate as of January 15, 2021  1:18 PM. If you have any questions, ask your nurse or doctor.          STOP taking these medications    benzonatate 200 MG capsule Commonly known as:  TESSALON Stopped by: Howard Pouch, DO   doxycycline 100 MG tablet Commonly known as: VIBRA-TABS Stopped by: Howard Pouch, DO   predniSONE 50 MG tablet Commonly known as: DELTASONE Stopped by: Howard Pouch, DO       TAKE these medications    albuterol 108 (90 Base) MCG/ACT inhaler Commonly known as: VENTOLIN HFA Inhale 2 puffs into the lungs every 4 (four) hours as needed for wheezing.   atorvastatin 80 MG tablet Commonly known as: LIPITOR Take 1 tablet (80 mg total) by mouth daily.   B-12 1000 MCG Caps Take 1 tablet by mouth daily.   B-complex with vitamin C tablet Take 1 tablet by mouth daily.   cholecalciferol 25 MCG (1000 UNIT) tablet Commonly known as: VITAMIN D3 Take 1,000 Units by mouth daily.   meloxicam 15 MG tablet Commonly known as: MOBIC Take 15 mg by mouth daily.   omeprazole 40  MG capsule Commonly known as: PRILOSEC Take 1 capsule (40 mg total) by mouth daily.   SUMAtriptan 100 MG tablet Commonly known as: IMITREX May repeat in 2 hours if headache persists or recurs.       All past medical history, surgical history, allergies, family history, immunizations andmedications were updated in the EMR today and reviewed under the history and medication portions of their EMR.     No results found for this or any previous visit (from the past 2160 hour(s)).  ROS: 14 pt review of systems performed and negative (unless mentioned in an HPI)  Objective: BP 135/78    Pulse 72    Temp 98.1 F (36.7 C) (Oral)    Ht 6\' 1"  (1.854 m)    Wt 212 lb (96.2 kg)    SpO2 97%    BMI 27.97 kg/m  Physical Exam Vitals and nursing note reviewed.  Constitutional:      General: He is not in acute distress.    Appearance: Normal appearance. He is normal weight. He is not ill-appearing, toxic-appearing or diaphoretic.  HENT:     Head: Normocephalic and atraumatic.     Mouth/Throat:     Mouth: Mucous membranes are moist.  Eyes:     General: No scleral icterus.       Right eye: No discharge.        Left eye: No discharge.     Extraocular Movements: Extraocular movements intact.     Pupils: Pupils are equal, round, and reactive to light.  Cardiovascular:     Rate and Rhythm: Normal rate and regular rhythm.  Pulmonary:     Effort: Pulmonary effort is normal. No respiratory distress.     Breath sounds: Normal breath sounds. No wheezing, rhonchi or rales.  Musculoskeletal:     Cervical back: Neck supple.  Lymphadenopathy:     Cervical: No cervical adenopathy.  Skin:    General: Skin is warm and dry.     Coloration: Skin is not jaundiced or pale.     Findings: No rash.  Neurological:     Mental Status: He is alert and oriented to person, place, and time. Mental status is at baseline.  Psychiatric:        Mood and Affect: Mood normal.        Behavior: Behavior normal.         Thought Content: Thought content normal.        Judgment: Judgment normal.     No results found.  Assessment/plan: Warren Endicott. is a 55 y.o. male present  for CPE Gastroesophageal reflux disease, esophagitis presence not specified/long-term PPI/vitamin D deficiency/B12 deficiency -continue prilosec 40 mg QD.  -Vitamin D, B12 and mag routine monitoring on long-term PPI. > he is supplementing.    Hyperlipidemia, unspecified hyperlipidemia type/overweight - Dietary modifications  and exercise encouraged - continue lipitor - cmp, cbc, tsh and lipids today -Continue fish oil supplementation   Migraine without aura and without status migrainosus, not intractable Stable  Continue Imitrex as needed Discussed if his headaches worsen I would recommend neuro consult- he reports 8-12 headache days a month now, but this has been chronic for him and responds to 1/2 tab Imitrex. He currently declines referral .   Influenza vaccine administered today  Return in about 1 year (around 01/16/2022) for CPE (30 min).  Orders Placed This Encounter  Procedures   Flu Vaccine QUAD 6+ mos PF IM (Fluarix Quad PF)   Comprehensive metabolic panel   Lipid panel   CBC   TSH   Meds ordered this encounter  Medications   SUMAtriptan (IMITREX) 100 MG tablet    Sig: May repeat in 2 hours if headache persists or recurs.    Dispense:  15 tablet    Refill:  11   omeprazole (PRILOSEC) 40 MG capsule    Sig: Take 1 capsule (40 mg total) by mouth daily.    Dispense:  90 capsule    Refill:  3   atorvastatin (LIPITOR) 80 MG tablet    Sig: Take 1 tablet (80 mg total) by mouth daily.    Dispense:  90 tablet    Refill:  3   albuterol (VENTOLIN HFA) 108 (90 Base) MCG/ACT inhaler    Sig: Inhale 2 puffs into the lungs every 4 (four) hours as needed for wheezing.    Dispense:  1 each    Refill:  1   Referral Orders  No referral(s) requested today     Note is dictated utilizing voice recognition  software. Although note has been proof read prior to signing, occasional typographical errors still can be missed. If any questions arise, please do not hesitate to call for verification.  Electronically signed by: Howard Pouch, DO [place]

## 2021-01-15 NOTE — Patient Instructions (Signed)
°  Great to see you today.  I have refilled the medication(s) we provide.   If labs were collected, we will inform you of lab results once received either by echart message or telephone call.   - echart message- for normal results that have been seen by the patient already.   - telephone call: abnormal results or if patient has not viewed results in their echart.   Follow up yearly for physical.

## 2021-02-24 ENCOUNTER — Encounter: Payer: Self-pay | Admitting: Family Medicine

## 2021-02-24 ENCOUNTER — Telehealth (INDEPENDENT_AMBULATORY_CARE_PROVIDER_SITE_OTHER): Payer: BC Managed Care – PPO | Admitting: Family Medicine

## 2021-02-24 ENCOUNTER — Other Ambulatory Visit: Payer: Self-pay

## 2021-02-24 VITALS — Temp 97.4°F | Ht 73.0 in | Wt 205.0 lb

## 2021-02-24 DIAGNOSIS — R051 Acute cough: Secondary | ICD-10-CM | POA: Diagnosis not present

## 2021-02-24 DIAGNOSIS — J209 Acute bronchitis, unspecified: Secondary | ICD-10-CM

## 2021-02-24 MED ORDER — PREDNISONE 20 MG PO TABS: 20.0000 mg | ORAL_TABLET | Freq: Every day | ORAL | 0 refills | Status: DC

## 2021-02-24 MED ORDER — DOXYCYCLINE HYCLATE 100 MG PO TABS: 100.0000 mg | ORAL_TABLET | Freq: Two times a day (BID) | ORAL | 0 refills | Status: DC

## 2021-02-24 NOTE — Progress Notes (Signed)
VIRTUAL VISIT VIA VIDEO  I connected with Warren Hall. on 02/24/21 at  2:00 PM EST by a video enabled telemedicine application and verified that I am speaking with the correct person using two identifiers. Location patient: Home Location provider: [facility], Office Persons participating in the virtual visit: Patient, Dr. Raoul Pitch and Cyndra Numbers, CMA  I discussed the limitations of evaluation and management by telemedicine and the availability of in person appointments. The patient expressed understanding and agreed to proceed.   Warren Hall , [birth year], 56 y.o., male MRN: [id number] Patient Care Team    Relationship Specialty Notifications Start End  [name], DO PCP - General Family Medicine  09/29/14   Zonia Kief, MD  Rehabilitation  12/09/16   Yetta Flock, MD Consulting Physician Gastroenterology  01/17/18     Chief Complaint  Patient presents with   Nasal Congestion    Pt c/o nasal congestion, nasal drainage, post nasal drip, ear fullness x 4 weeks;      Subjective: Pt presents for an OV with complaints of 3-4 weeks of cough, nasal congestion, runny nose and fullness of his ears. He states he had a head cold about 3-4 weeks ago. Symptoms improved after 1-2 weeks, but since returned. He reports copious thick phlegm production in his throat that he can not seem to clear.   Depression screen [facility] 2/9 01/15/2021 01/09/2020 01/17/2018 12/08/2016 12/07/2015  Decreased Interest 0 0 0 0 0  Down, Depressed, Hopeless 0 0 0 0 0  PHQ - 2 Score 0 0 0 0 0   No Known Allergies Social History   Social History Narrative   Mr Sappenfield lives with his daughter. He works Medical laboratory scientific officer.   Past Medical History:  Diagnosis Date   Allergy    Seasonal   Frequent headaches    Hyperlipidemia    Lumbar herniated disc 2001   conservative treatment   Migraines    Seasonal asthma    Past Surgical History:  Procedure Laterality Date   ACHILLES TENDON REPAIR      LASIK     SHOULDER SURGERY     Family History  Problem Relation Age of Onset   COPD Mother    Hypertension Father    Colon polyps Father    COPD Maternal Grandmother    Multiple sclerosis Maternal Grandfather    Cancer Paternal Grandfather        lung   Colon cancer Neg Hx    Esophageal cancer Neg Hx    Rectal cancer Neg Hx    Stomach cancer Neg Hx    Allergies as of 02/24/2021   No Known Allergies      Medication List        Accurate as of February 24, 2021  2:23 PM. If you have any questions, ask your nurse or doctor.          albuterol 108 (90 Base) MCG/ACT inhaler Commonly known as: VENTOLIN HFA Inhale 2 puffs into the lungs every 4 (four) hours as needed for wheezing.   atorvastatin 80 MG tablet Commonly known as: LIPITOR Take 1 tablet (80 mg total) by mouth daily.   B-12 1000 MCG Caps Take 1 tablet by mouth daily.   B-complex with vitamin C tablet Take 1 tablet by mouth daily.   cholecalciferol 25 MCG (1000 UNIT) tablet Commonly known as: VITAMIN D3 Take 1,000 Units by mouth daily.   doxycycline 100 MG tablet Commonly  known as: VIBRA-TABS Take 1 tablet (100 mg total) by mouth 2 (two) times daily. Started by: Howard Pouch, DO   meloxicam 15 MG tablet Commonly known as: MOBIC Take 15 mg by mouth daily.   omeprazole 40 MG capsule Commonly known as: PRILOSEC Take 1 capsule (40 mg total) by mouth daily.   predniSONE 20 MG tablet Commonly known as: DELTASONE Take 1 tablet (20 mg total) by mouth daily with breakfast. Started by: Howard Pouch, DO   SUMAtriptan 100 MG tablet Commonly known as: IMITREX May repeat in 2 hours if headache persists or recurs.        All past medical history, surgical history, allergies, family history, immunizations andmedications were updated in the EMR today and reviewed under the history and medication portions of their EMR.     Review of Systems  Constitutional:  Negative for chills, fever and malaise/fatigue.   HENT:  Positive for congestion and sinus pain. Negative for ear discharge, ear pain, nosebleeds, sore throat and tinnitus.   Respiratory:  Positive for cough and sputum production. Negative for shortness of breath and wheezing.   Skin:  Negative for rash.  Neurological:  Negative for headaches.  Negative, with the exception of above mentioned in HPI   Objective:  Temp (!) 97.4 F (36.3 C) (Temporal)    Ht 6\' 1"  (1.854 m)    Wt 205 lb (93 kg)    BMI 27.05 kg/m  Body mass index is 27.05 kg/m.  Physical Exam Vitals and nursing note reviewed.  Constitutional:      General: He is not in acute distress.    Appearance: Normal appearance. He is not ill-appearing, toxic-appearing or diaphoretic.  HENT:     Head: Normocephalic and atraumatic.     Mouth/Throat:     Mouth: Mucous membranes are moist.  Eyes:     General: No scleral icterus.       Right eye: No discharge.        Left eye: No discharge.     Extraocular Movements: Extraocular movements intact.     Pupils: Pupils are equal, round, and reactive to light.  Pulmonary:     Effort: Pulmonary effort is normal. No respiratory distress.  Skin:    General: Skin is warm and dry.     Coloration: Skin is not jaundiced or pale.     Findings: No rash.  Neurological:     Mental Status: He is alert and oriented to person, place, and time. Mental status is at baseline.  Psychiatric:        Mood and Affect: Mood normal.        Behavior: Behavior normal.        Thought Content: Thought content normal.        Judgment: Judgment normal.     No results found. No results found. No results found for this or any previous visit (from the past 24 hour(s)).  Assessment/Plan: Jermon Chalfant. is a 56 y.o. male present for OV for  Acute bronchitis with symptoms > 10 days/Acute cough Rest, hydrate.  +mucinex (DM if cough), nettie pot or nasal saline.  Doxy BID prescribed, take until completed.  Prednisone burst Hot tea & honey If cough  present it can last up to 6-8 weeks.  F/U 2 weeks if not improved.    Reviewed expectations re: course of current medical issues. Discussed self-management of symptoms. Outlined signs and symptoms indicating need for more acute intervention. Patient verbalized understanding and all questions  were answered. Patient received an After-Visit Summary.    No orders of the defined types were placed in this encounter.  Meds ordered this encounter  Medications   doxycycline (VIBRA-TABS) 100 MG tablet    Sig: Take 1 tablet (100 mg total) by mouth 2 (two) times daily.    Dispense:  20 tablet    Refill:  0   predniSONE (DELTASONE) 20 MG tablet    Sig: Take 1 tablet (20 mg total) by mouth daily with breakfast.    Dispense:  10 tablet    Refill:  0   Referral Orders  No referral(s) requested today     Note is dictated utilizing voice recognition software. Although note has been proof read prior to signing, occasional typographical errors still can be missed. If any questions arise, please do not hesitate to call for verification.   electronically signed by:  Howard Pouch, DO  [place]

## 2021-02-24 NOTE — Patient Instructions (Signed)
Acute Bronchitis, Adult °Acute bronchitis is sudden inflammation of the main airways (bronchi) that come off the windpipe (trachea) in the lungs. The swelling causes the airways to get smaller and make more mucus than normal. This can make it hard to breathe and can cause coughing or noisy breathing (wheezing). °Acute bronchitis may last several weeks. The cough may last longer. Allergies, asthma, and exposure to smoke may make the condition worse. °What are the causes? °This condition can be caused by germs and by substances that irritate the lungs, including: °Cold and flu viruses. The most common cause of this condition is the virus that causes the common cold. °Bacteria. This is less common. °Breathing in substances that irritate the lungs, including: °Smoke from cigarettes and other forms of tobacco. °Dust and pollen. °Fumes from household cleaning products, gases, or burned fuel. °Indoor or outdoor air pollution. °What increases the risk? °The following factors may make you more likely to develop this condition: °A weak body's defense system, also called the immune system. °A condition that affects your lungs and breathing, such as asthma. °What are the signs or symptoms? °Common symptoms of this condition include: °Coughing. This may bring up clear, yellow, or green mucus from your lungs (sputum). °Wheezing. °Runny or stuffy nose. °Having too much mucus in your lungs (chest congestion). °Shortness of breath. °Aches and pains, including sore throat or chest. °How is this diagnosed? °This condition is usually diagnosed based on: °Your symptoms and medical history. °A physical exam. °You may also have other tests, including tests to rule out other conditions, such as pneumonia. These tests include: °A test of lung function. °Test of a mucus sample to look for the presence of bacteria. °Tests to check the oxygen level in your blood. °Blood tests. °Chest X-ray. °How is this treated? °Most cases of acute bronchitis  clear up over time without treatment. Your health care provider may recommend: °Drinking more fluids to help thin your mucus so it is easier to cough up. °Taking inhaled medicine (inhaler) to improve air flow in and out of your lungs. °Using a vaporizer or a humidifier. These are machines that add water to the air to help you breathe better. °Taking a medicine that thins mucus and clears congestion (expectorant). °Taking a medicine that prevents or stops coughing (cough suppressant). °It is notcommon to take an antibiotic medicine for this condition. °Follow these instructions at home: ° °Take over-the-counter and prescription medicines only as told by your health care provider. °Use an inhaler, vaporizer, or humidifier as told by your health care provider. °Take two teaspoons (10 mL) of honey at bedtime to lessen coughing at night. °Drink enough fluid to keep your urine pale yellow. °Do not use any products that contain nicotine or tobacco. These products include cigarettes, chewing tobacco, and vaping devices, such as e-cigarettes. If you need help quitting, ask your health care provider. °Get plenty of rest. °Return to your normal activities as told by your health care provider. Ask your health care provider what activities are safe for you. °Keep all follow-up visits. This is important. °How is this prevented? °To lower your risk of getting this condition again: °Wash your hands often with soap and water for at least 20 seconds. If soap and water are not available, use hand sanitizer. °Avoid contact with people who have cold symptoms. °Try not to touch your mouth, nose, or eyes with your hands. °Avoid breathing in smoke or chemical fumes. Breathing smoke or chemical fumes will make your condition   worse. °Get the flu shot every year. °Contact a health care provider if: °Your symptoms do not improve after 2 weeks. °You have trouble coughing up the mucus. °Your cough keeps you awake at night. °You have a  fever. °Get help right away if you: °Cough up blood. °Feel pain in your chest. °Have severe shortness of breath. °Faint or keep feeling like you are going to faint. °Have a severe headache. °Have a fever or chills that get worse. °These symptoms may represent a serious problem that is an emergency. Do not wait to see if the symptoms will go away. Get medical help right away. Call your local emergency services (911 in the [place] Do not drive yourself to the hospital. °Summary °Acute bronchitis is inflammation of the main airways (bronchi) that come off the windpipe (trachea) in the lungs. The swelling causes the airways to get smaller and make more mucus than normal. °Drinking more fluids can help thin your mucus so it is easier to cough up. °Take over-the-counter and prescription medicines only as told by your health care provider. °Do not use any products that contain nicotine or tobacco. These products include cigarettes, chewing tobacco, and vaping devices, such as e-cigarettes. If you need help quitting, ask your health care provider. °Contact a health care provider if your symptoms do not improve after 2 weeks. °This information is not intended to replace advice given to you by your health care provider. Make sure you discuss any questions you have with your health care provider. °Document Revised: 05/20/2020 Document Reviewed: 05/20/2020 °Elsevier Patient Education © 2022 [employer] Inc. ° °

## 2021-03-22 ENCOUNTER — Other Ambulatory Visit: Payer: Self-pay

## 2021-03-22 ENCOUNTER — Encounter: Payer: Self-pay | Admitting: Family Medicine

## 2021-03-22 ENCOUNTER — Ambulatory Visit (INDEPENDENT_AMBULATORY_CARE_PROVIDER_SITE_OTHER): Payer: BC Managed Care – PPO | Admitting: Family Medicine

## 2021-03-22 VITALS — BP 122/66 | HR 65 | Temp 97.9°F | Ht 73.0 in | Wt 206.0 lb

## 2021-03-22 DIAGNOSIS — J0140 Acute pansinusitis, unspecified: Secondary | ICD-10-CM | POA: Diagnosis not present

## 2021-03-22 MED ORDER — ZYRTEC ALLERGY 10 MG PO TABS: 10.0000 mg | ORAL_TABLET | Freq: Every day | ORAL | 3 refills | Status: DC

## 2021-03-22 MED ORDER — FLUTICASONE PROPIONATE 50 MCG/ACT NA SUSP: 2.0000 | Freq: Every day | NASAL | 6 refills | Status: DC

## 2021-03-22 NOTE — Progress Notes (Signed)
Unknown Foley. , 1965/04/07, 56 y.o., male MRN: [id number] Patient Care Team    Relationship Specialty Notifications Start End  [name], DO PCP - General Family Medicine  09/29/14   Zonia Kief, MD  Rehabilitation  12/09/16   Yetta Flock, MD Consulting Physician Gastroenterology  01/17/18     Chief Complaint  Patient presents with   Nasal Congestion    Pt c/o intermittent congestion and nasal drip x 1 mo;      Subjective: Warren Hall. is a 56 y.o. male presents today for follow-up on his nasal congestion and rhinorrhea.  He states it is intermittent.  He was seen couple weeks ago in provided with steroid and antibiotic after 4 weeks of symptoms at that time.  He is taking his antihistamine daily.  He is not using Flonase nasal spray routinely.  He reports when he gets symptoms it will feel like he cannot breathe through his nose very well and he will have a lot of phlegm in the postnasal pharynx. Prior note: Pt presents for an OV with complaints of 3-4 weeks of cough, nasal congestion, runny nose and fullness of his ears. He states he had a head cold about 3-4 weeks ago. Symptoms improved after 1-2 weeks, but since returned. He reports copious thick phlegm production in his throat that he can not seem to clear.   Depression screen [facility] 2/9 01/15/2021 01/09/2020 01/17/2018 12/08/2016 12/07/2015  Decreased Interest 0 0 0 0 0  Down, Depressed, Hopeless 0 0 0 0 0  PHQ - 2 Score 0 0 0 0 0   No Known Allergies Social History   Social History Narrative   Warren Hall lives with his daughter. He works Medical laboratory scientific officer.   Past Medical History:  Diagnosis Date   Allergy    Seasonal   Frequent headaches    Hyperlipidemia    Lumbar herniated disc 2001   conservative treatment   Migraines    Seasonal asthma    Past Surgical History:  Procedure Laterality Date   ACHILLES TENDON REPAIR     LASIK     SHOULDER SURGERY     Family History  Problem Relation Age of Onset    COPD Mother    Hypertension Father    Colon polyps Father    COPD Maternal Grandmother    Multiple sclerosis Maternal Grandfather    Cancer Paternal Grandfather        lung   Colon cancer Neg Hx    Esophageal cancer Neg Hx    Rectal cancer Neg Hx    Stomach cancer Neg Hx    Allergies as of 03/22/2021   No Known Allergies      Medication List        Accurate as of March 22, 2021  4:38 PM. If you have any questions, ask your nurse or doctor.          STOP taking these medications    doxycycline 100 MG tablet Commonly known as: VIBRA-TABS Stopped by: Howard Pouch, DO   predniSONE 20 MG tablet Commonly known as: DELTASONE Stopped by: Howard Pouch, DO       TAKE these medications    albuterol 108 (90 Base) MCG/ACT inhaler Commonly known as: VENTOLIN HFA Inhale 2 puffs into the lungs every 4 (four) hours as needed for wheezing.   atorvastatin 80 MG tablet Commonly known as: LIPITOR Take 1 tablet (80 mg total) by mouth daily.  B-12 1000 MCG Caps Take 1 tablet by mouth daily.   B-complex with vitamin C tablet Take 1 tablet by mouth daily.   cholecalciferol 25 MCG (1000 UNIT) tablet Commonly known as: VITAMIN D3 Take 1,000 Units by mouth daily.   fluticasone 50 MCG/ACT nasal spray Commonly known as: FLONASE Place 2 sprays into both nostrils daily. Started by: Howard Pouch, DO   meloxicam 15 MG tablet Commonly known as: MOBIC Take 15 mg by mouth daily.   omeprazole 40 MG capsule Commonly known as: PRILOSEC Take 1 capsule (40 mg total) by mouth daily.   SUMAtriptan 100 MG tablet Commonly known as: IMITREX May repeat in 2 hours if headache persists or recurs.   ZyrTEC Allergy 10 MG tablet Generic drug: cetirizine Take 1 tablet (10 mg total) by mouth daily. Started by: Howard Pouch, DO        All past medical history, surgical history, allergies, family history, immunizations andmedications were updated in the EMR today and reviewed under  the history and medication portions of their EMR.     Review of Systems  Constitutional:  Negative for chills, fever and malaise/fatigue.  HENT:  Positive for congestion. Negative for ear discharge, ear pain, nosebleeds, sinus pain, sore throat and tinnitus.   Respiratory:  Negative for cough, sputum production, shortness of breath and wheezing.   Skin:  Negative for rash.  Neurological:  Negative for headaches.  Negative, with the exception of above mentioned in HPI   Objective:  BP 122/66    Pulse 65    Temp 97.9 F (36.6 C) (Oral)    Ht 6\' 1"  (1.854 m)    Wt 206 lb (93.4 kg)    SpO2 96%    BMI 27.18 kg/m  Body mass index is 27.18 kg/m. Physical Exam Vitals and nursing note reviewed.  Constitutional:      General: He is not in acute distress.    Appearance: Normal appearance. He is not ill-appearing, toxic-appearing or diaphoretic.  HENT:     Head: Normocephalic and atraumatic.     Right Ear: Tympanic membrane, ear canal and external ear normal. There is no impacted cerumen.     Left Ear: Tympanic membrane, ear canal and external ear normal. There is no impacted cerumen.     Nose:     Comments: Mild swelling posterior nares.  PND present    Mouth/Throat:     Mouth: Mucous membranes are moist.     Pharynx: Oropharynx is clear. No oropharyngeal exudate or posterior oropharyngeal erythema.  Eyes:     General: No scleral icterus.       Right eye: No discharge.        Left eye: No discharge.     Extraocular Movements: Extraocular movements intact.     Conjunctiva/sclera: Conjunctivae normal.     Pupils: Pupils are equal, round, and reactive to light.  Cardiovascular:     Rate and Rhythm: Normal rate and regular rhythm.     Heart sounds: No murmur heard. Pulmonary:     Effort: Pulmonary effort is normal. No respiratory distress.     Breath sounds: Normal breath sounds. No wheezing, rhonchi or rales.  Musculoskeletal:     Cervical back: Neck supple. No tenderness.   Lymphadenopathy:     Cervical: No cervical adenopathy.  Skin:    General: Skin is warm and dry.     Coloration: Skin is not jaundiced or pale.     Findings: No rash.  Neurological:  Mental Status: He is alert and oriented to person, place, and time. Mental status is at baseline.  Psychiatric:        Mood and Affect: Mood normal.        Behavior: Behavior normal.        Thought Content: Thought content normal.        Judgment: Judgment normal.     No results found. No results found. No results found for this or any previous visit (from the past 24 hour(s)).  Assessment/Plan: Lonnie Reth. is a 56 y.o. male present for OV for  Subacute pansinusitis Rest, hydrate.  Start  flonase QD Continue zyrtec Start nasal navage system Currently not infectious but he does have swelling in the posterior nares.  Could consider adding on Singulair if above recommendations are not helpful.  Reviewed expectations re: course of current medical issues. Discussed self-management of symptoms. Outlined signs and symptoms indicating need for more acute intervention. Patient verbalized understanding and all questions were answered. Patient received an After-Visit Summary.    No orders of the defined types were placed in this encounter.  Meds ordered this encounter  Medications   fluticasone (FLONASE) 50 MCG/ACT nasal spray    Sig: Place 2 sprays into both nostrils daily.    Dispense:  16 g    Refill:  6   ZYRTEC ALLERGY 10 MG tablet    Sig: Take 1 tablet (10 mg total) by mouth daily.    Dispense:  90 tablet    Refill:  3   Referral Orders  No referral(s) requested today     Note is dictated utilizing voice recognition software. Although note has been proof read prior to signing, occasional typographical errors still can be missed. If any questions arise, please do not hesitate to call for verification.   electronically signed by:  Howard Pouch, DO  [place]

## 2021-03-22 NOTE — Patient Instructions (Signed)

## 2021-08-17 DIAGNOSIS — K219 Gastro-esophageal reflux disease without esophagitis: Secondary | ICD-10-CM | POA: Insufficient documentation

## 2021-09-28 DIAGNOSIS — M1611 Unilateral primary osteoarthritis, right hip: Secondary | ICD-10-CM | POA: Diagnosis not present

## 2021-10-20 DIAGNOSIS — M25551 Pain in right hip: Secondary | ICD-10-CM | POA: Diagnosis not present

## 2021-11-01 ENCOUNTER — Telehealth: Payer: Self-pay

## 2021-11-01 ENCOUNTER — Other Ambulatory Visit: Payer: Self-pay | Admitting: Family Medicine

## 2021-11-01 NOTE — Telephone Encounter (Signed)
Advised pt to call pharmacy and have them look med up by name. Omeprazole filled for 1 year on 01/15/21  [place] Day - Client Nonclinical Telephone Record  [facility] Client [place] Day - Client Client Site [place] Provider Raoul Pitch, [place] Type Call Who Is Calling Patient / Member / Family / Caregiver Caller Name Sidhant Helderman Caller Phone Number [phone] Patient Name Nashon Erbes Patient DOB 1965-11-22 Call Type Message Only Information Provided Reason for Call Medication Question / Request Initial Comment Caller states he is calling about his methropozol and the pharmacy is having trouble getting it updated. Additional Comment Denied triage. Provided office hours. Disp. Time Disposition Final User 11/01/2021 12:35:50 PM General Information Provided Yes Socrates, Janett Billow Call Closed By: Orvan Falconer Transaction Date/Time: 11/01/2021 12:33:11 PM (ET)

## 2021-11-07 ENCOUNTER — Other Ambulatory Visit: Payer: Self-pay | Admitting: Family Medicine

## 2021-11-08 ENCOUNTER — Telehealth: Payer: Self-pay | Admitting: Family Medicine

## 2021-11-08 NOTE — Telephone Encounter (Signed)
Pt should have enough to last until appt. Rx was sent for 1 year supply in Dec 2022

## 2021-11-08 NOTE — Telephone Encounter (Signed)
LM for pt to return call to discuss.  Both Rx is getting filled. When in the last week of Rx please call office to inform clinic staff that refill is needed to last until appt

## 2021-11-08 NOTE — Telephone Encounter (Signed)
Pt reports he received a text from [facility] stating they will not fill his rx for Omeprazole or Atorvastatin. He ask that maybe some can be called in until his appt on 12/18.

## 2021-11-09 NOTE — Telephone Encounter (Signed)
LM for pt to return call to discuss.  

## 2022-01-05 ENCOUNTER — Other Ambulatory Visit: Payer: Self-pay | Admitting: Family Medicine

## 2022-01-06 DIAGNOSIS — J209 Acute bronchitis, unspecified: Secondary | ICD-10-CM | POA: Diagnosis not present

## 2022-01-06 DIAGNOSIS — J00 Acute nasopharyngitis [common cold]: Secondary | ICD-10-CM | POA: Diagnosis not present

## 2022-01-07 ENCOUNTER — Ambulatory Visit: Payer: BC Managed Care – PPO | Admitting: Family Medicine

## 2022-01-17 ENCOUNTER — Ambulatory Visit (INDEPENDENT_AMBULATORY_CARE_PROVIDER_SITE_OTHER): Payer: BC Managed Care – PPO | Admitting: Family Medicine

## 2022-01-17 ENCOUNTER — Encounter: Payer: Self-pay | Admitting: Family Medicine

## 2022-01-17 VITALS — BP 131/71 | HR 55 | Temp 98.2°F | Ht 73.0 in | Wt 211.0 lb

## 2022-01-17 DIAGNOSIS — K635 Polyp of colon: Secondary | ICD-10-CM | POA: Diagnosis not present

## 2022-01-17 DIAGNOSIS — G43009 Migraine without aura, not intractable, without status migrainosus: Secondary | ICD-10-CM

## 2022-01-17 DIAGNOSIS — Z0000 Encounter for general adult medical examination without abnormal findings: Secondary | ICD-10-CM | POA: Diagnosis not present

## 2022-01-17 DIAGNOSIS — Z79899 Other long term (current) drug therapy: Secondary | ICD-10-CM | POA: Diagnosis not present

## 2022-01-17 DIAGNOSIS — E785 Hyperlipidemia, unspecified: Secondary | ICD-10-CM

## 2022-01-17 DIAGNOSIS — J45998 Other asthma: Secondary | ICD-10-CM | POA: Diagnosis not present

## 2022-01-17 DIAGNOSIS — K219 Gastro-esophageal reflux disease without esophagitis: Secondary | ICD-10-CM

## 2022-01-17 DIAGNOSIS — Z125 Encounter for screening for malignant neoplasm of prostate: Secondary | ICD-10-CM

## 2022-01-17 DIAGNOSIS — Z23 Encounter for immunization: Secondary | ICD-10-CM | POA: Diagnosis not present

## 2022-01-17 LAB — COMPREHENSIVE METABOLIC PANEL
ALT: 27 U/L (ref 0–53)
AST: 18 U/L (ref 0–37)
Albumin: 4.5 g/dL (ref 3.5–5.2)
Alkaline Phosphatase: 51 U/L (ref 39–117)
BUN: 20 mg/dL (ref 6–23)
CO2: 29 mEq/L (ref 19–32)
Calcium: 9.4 mg/dL (ref 8.4–10.5)
Chloride: 102 mEq/L (ref 96–112)
Creatinine, Ser: 1.09 mg/dL (ref 0.40–1.50)
GFR: 76.43 mL/min (ref >60.00)
Glucose, Bld: 93 mg/dL (ref 70–99)
Potassium: 4 mEq/L (ref 3.5–5.1)
Sodium: 140 mEq/L (ref 135–145)
Total Bilirubin: 0.8 mg/dL (ref 0.2–1.2)
Total Protein: 6.7 g/dL (ref 6.0–8.3)

## 2022-01-17 LAB — TSH: TSH: 1.6 u[IU]/mL (ref 0.35–5.50)

## 2022-01-17 LAB — LIPID PANEL
Cholesterol: 178 mg/dL (ref 0–200)
HDL: 42.5 mg/dL (ref >39.00)
NonHDL: 135.81
Total CHOL/HDL Ratio: 4
Triglycerides: 342 mg/dL — ABNORMAL HIGH (ref 0.0–149.0)
VLDL: 68.4 mg/dL — ABNORMAL HIGH (ref 0.0–40.0)

## 2022-01-17 LAB — CBC
HCT: 43.7 % (ref 39.0–52.0)
Hemoglobin: 15.2 g/dL (ref 13.0–17.0)
MCHC: 34.9 g/dL (ref 30.0–36.0)
MCV: 93.9 fl (ref 78.0–100.0)
Platelets: 297 10*3/uL (ref 150.0–400.0)
RBC: 4.66 Mil/uL (ref 4.22–5.81)
RDW: 13 % (ref 11.5–15.5)
WBC: 8.2 10*3/uL (ref 4.0–10.5)

## 2022-01-17 LAB — PSA: PSA: 2.69 ng/mL (ref 0.10–4.00)

## 2022-01-17 LAB — HEMOGLOBIN A1C: Hgb A1c MFr Bld: 5.7 % (ref 4.6–6.5)

## 2022-01-17 LAB — LDL CHOLESTEROL, DIRECT: Direct LDL: 58 mg/dL

## 2022-01-17 MED ORDER — ATORVASTATIN CALCIUM 80 MG PO TABS: 80.0000 mg | ORAL_TABLET | Freq: Every day | ORAL | 3 refills | Status: DC

## 2022-01-17 MED ORDER — ALBUTEROL SULFATE HFA 108 (90 BASE) MCG/ACT IN AERS: 2.0000 | INHALATION_SPRAY | RESPIRATORY_TRACT | 11 refills | Status: DC | PRN

## 2022-01-17 MED ORDER — SUMATRIPTAN SUCCINATE 100 MG PO TABS: ORAL_TABLET | ORAL | 11 refills | Status: DC

## 2022-01-17 MED ORDER — ZYRTEC ALLERGY 10 MG PO TABS: 10.0000 mg | ORAL_TABLET | Freq: Every day | ORAL | 3 refills | Status: DC

## 2022-01-17 MED ORDER — OMEPRAZOLE 40 MG PO CPDR: 40.0000 mg | DELAYED_RELEASE_CAPSULE | Freq: Every day | ORAL | 3 refills | Status: DC

## 2022-01-17 MED ORDER — FLUTICASONE PROPIONATE 50 MCG/ACT NA SUSP: 2.0000 | Freq: Every day | NASAL | 6 refills | Status: DC

## 2022-01-17 NOTE — Progress Notes (Signed)
Patient ID: Unknown Warren Hall., male  DOB: 11-16-1965, 56 y.o.   MRN: [id number] Patient Care Team    Relationship Specialty Notifications Start End  [name], DO PCP - General Family Medicine  09/29/14   Zonia Kief, MD  Rehabilitation  12/09/16   Yetta Flock, MD Consulting Physician Gastroenterology  01/17/18     Chief Complaint  Patient presents with   Annual Exam    Pt is not fasting; cmc    Subjective: Warren Hall. is a 56 y.o. male present for cpe and Chronic Conditions/illness Management All past medical history, surgical history, allergies, family history, immunizations, medications and social history were updated in the electronic medical record today. All recent labs, ED visits and hospitalizations within the last year were reviewed.  Health maintenance:  Colonoscopy:2018- Dr. Havery Moros 5 yr recall due. Referral placed. He was encouraged to cal las well.  Immunizations: tdap UTD 2015, influenza completed today, shingrix declined Infectious disease screening: HIV completed, Hep C completed PSA: no fhx. Psa collected today Lab Results  Component Value Date   PSA 1.14 01/09/2020   PSA 1.97 01/17/2018   PSA 4.13 (H) 12/08/2016    Hyperlipidemia: Patient reports compliance lipitor 80 qd.   GERD: Patient reports he is feeling well  on omeprazole 40 mg daily.  He has been unable to taper down on medication without return of symptoms.   Vitamin D, B12 and mag have been stable and he supplements vit d and b12.   Migraines: using imitrex PRN 1-3 (1/2 tab) a week. This has been chronic for him.       01/17/2022    1:03 PM 01/15/2021    1:05 PM 01/09/2020   10:36 AM 01/17/2018    1:15 PM 12/08/2016   10:06 AM  Depression screen PHQ 2/9  Decreased Interest 0 0 0 0 0  Down, Depressed, Hopeless 0 0 0 0 0  PHQ - 2 Score 0 0 0 0 0       No data to display                 01/09/2020   11:47 AM 12/07/2015    8:22 AM  Fall Risk   Falls in  the past year? 0 No  Number falls in past yr: 0   Injury with Fall? 0   Follow up Falls evaluation completed     Immunization History  Administered Date(s) Administered   Influenza,inj,Quad PF,6+ Mos 11/27/2013, 10/21/2014, 12/07/2015, 12/08/2016, 01/17/2018, 01/15/2021, 01/17/2022   Tdap 11/27/2013   Past Medical History:  Diagnosis Date   Allergy    Seasonal   COVID-19 07/01/2020   Frequent headaches    Hyperlipidemia    Lumbar herniated disc 2001   conservative treatment   Migraines    Seasonal asthma    No Known Allergies Past Surgical History:  Procedure Laterality Date   ACHILLES TENDON REPAIR     LASIK     SHOULDER SURGERY     Family History  Problem Relation Age of Onset   COPD Mother    Hypertension Father    Colon polyps Father    COPD Maternal Grandmother    Multiple sclerosis Maternal Grandfather    Cancer Paternal Grandfather        lung   Colon cancer Neg Hx    Esophageal cancer Neg Hx    Rectal cancer Neg Hx    Stomach cancer Neg Hx    Social  History   Social History Narrative   Warren Hall lives with his daughter. He works Medical laboratory scientific officer.    Allergies as of 01/17/2022   No Known Allergies      Medication List        Accurate as of January 17, 2022  1:25 PM. If you have any questions, ask your nurse or doctor.          albuterol 108 (90 Base) MCG/ACT inhaler Commonly known as: VENTOLIN HFA Inhale 2 puffs into the lungs every 4 (four) hours as needed for wheezing.   atorvastatin 80 MG tablet Commonly known as: LIPITOR Take 1 tablet (80 mg total) by mouth daily.   B-12 1000 MCG Caps Take 1 tablet by mouth daily.   B-complex with vitamin C tablet Take 1 tablet by mouth daily.   cholecalciferol 25 MCG (1000 UNIT) tablet Commonly known as: VITAMIN D3 Take 1,000 Units by mouth daily.   fluticasone 50 MCG/ACT nasal spray Commonly known as: FLONASE Place 2 sprays into both nostrils daily.   meloxicam 15 MG tablet Commonly known as:  MOBIC Take 15 mg by mouth daily.   omeprazole 40 MG capsule Commonly known as: PRILOSEC Take 1 capsule (40 mg total) by mouth daily.   SUMAtriptan 100 MG tablet Commonly known as: IMITREX May repeat in 2 hours if headache persists or recurs.   ZyrTEC Allergy 10 MG tablet Generic drug: cetirizine Take 1 tablet (10 mg total) by mouth daily.       All past medical history, surgical history, allergies, family history, immunizations andmedications were updated in the EMR today and reviewed under the history and medication portions of their EMR.     No results found for this or any previous visit (from the past 2160 hour(s)).  ROS: 14 pt review of systems performed and negative (unless mentioned in an HPI)  Objective: BP 131/71   Pulse (!) 55   Temp 98.2 F (36.8 C) (Oral)   Ht '6\' 1"'$  (1.854 m)   Wt 211 lb (95.7 kg)   SpO2 98%   BMI 27.84 kg/m  Physical Exam Vitals and nursing note reviewed.  Constitutional:      General: He is not in acute distress.    Appearance: Normal appearance. He is normal weight. He is not ill-appearing, toxic-appearing or diaphoretic.  HENT:     Head: Normocephalic and atraumatic.     Mouth/Throat:     Mouth: Mucous membranes are moist.  Eyes:     General: No scleral icterus.       Right eye: No discharge.        Left eye: No discharge.     Extraocular Movements: Extraocular movements intact.     Pupils: Pupils are equal, round, and reactive to light.  Cardiovascular:     Rate and Rhythm: Normal rate and regular rhythm.  Pulmonary:     Effort: Pulmonary effort is normal. No respiratory distress.     Breath sounds: Normal breath sounds. No wheezing, rhonchi or rales.  Musculoskeletal:     Cervical back: Neck supple.  Lymphadenopathy:     Cervical: No cervical adenopathy.  Skin:    General: Skin is warm and dry.     Coloration: Skin is not jaundiced or pale.     Findings: No rash.  Neurological:     Mental Status: He is alert and  oriented to person, place, and time. Mental status is at baseline.  Psychiatric:        Mood  and Affect: Mood normal.        Behavior: Behavior normal.        Thought Content: Thought content normal.        Judgment: Judgment normal.     No results found.  Assessment/plan: [name]. is a 56 y.o. male present for CPE/CMC Gastroesophageal reflux disease, esophagitis presence not specified/long-term PPI/vitamin D deficiency/B12 deficiency -continue prilosec 40 mg QD.  -Vitamin D, B12 and mag routine monitoring on long-term PPI. > he is supplementing.    Hyperlipidemia, unspecified hyperlipidemia type/overweight - Dietary modifications  and exercise encouraged - continue lipitor - cmp, cbc, tsh and lipids today -Continue fish oil supplementation   Migraine without aura and without status migrainosus, not intractable Stable  Continue Imitrex as needed Discussed if his headaches worsen I would recommend neuro consult- he reports 8-12 headache days a month now, but this has been chronic for him and responds to 1/2 tab Imitrex. He currently declines referral .    Prostate cancer screening - PSA Polyp of colon, unspecified part of colon, unspecified type - Ambulatory referral to Gastroenterology Encounter for long-term current use of medication - Hemoglobin A1c - TSH Influenza vaccine needed - Flu Vaccine QUAD 6+ mos PF IM (Fluarix Quad PF) Routine general medical examination at a health care facility Patient was encouraged to exercise greater than 150 minutes a week. Patient was encouraged to choose a diet filled with fresh fruits and vegetables, and lean meats. AVS provided to patient today for education/recommendation on gender specific health and safety maintenance. Colonoscopy:2018- Dr. Havery Moros 5 yr recall due. Referral placed. He was encouraged to cal las well.  Immunizations: tdap UTD 2015, influenza completed today, shingrix declined Infectious disease  screening: HIV completed, Hep C completed PSA: no fhx. Psa collected today  Return in about 1 year (around 01/19/2023) for cpe (20 min), Routine chronic condition follow-up.  Orders Placed This Encounter  Procedures   Flu Vaccine QUAD 6+ mos PF IM (Fluarix Quad PF)   CBC   Comprehensive metabolic panel   Hemoglobin A1c   Lipid panel   PSA   TSH   Ambulatory referral to Gastroenterology   Meds ordered this encounter  Medications   atorvastatin (LIPITOR) 80 MG tablet    Sig: Take 1 tablet (80 mg total) by mouth daily.    Dispense:  90 tablet    Refill:  3   albuterol (VENTOLIN HFA) 108 (90 Base) MCG/ACT inhaler    Sig: Inhale 2 puffs into the lungs every 4 (four) hours as needed for wheezing.    Dispense:  1 each    Refill:  11   omeprazole (PRILOSEC) 40 MG capsule    Sig: Take 1 capsule (40 mg total) by mouth daily.    Dispense:  90 capsule    Refill:  3   SUMAtriptan (IMITREX) 100 MG tablet    Sig: May repeat in 2 hours if headache persists or recurs.    Dispense:  15 tablet    Refill:  11   ZYRTEC ALLERGY 10 MG tablet    Sig: Take 1 tablet (10 mg total) by mouth daily.    Dispense:  90 tablet    Refill:  3   fluticasone (FLONASE) 50 MCG/ACT nasal spray    Sig: Place 2 sprays into both nostrils daily.    Dispense:  16 g    Refill:  6   Referral Orders         Ambulatory referral to  Gastroenterology       Note is dictated utilizing voice recognition software. Although note has been proof read prior to signing, occasional typographical errors still can be missed. If any questions arise, please do not hesitate to call for verification.  Electronically signed by: Howard Pouch, DO [place]

## 2022-01-17 NOTE — Patient Instructions (Addendum)
Return in about 1 year (around 01/19/2023) for cpe (20 min), Routine chronic condition follow-up.  [facility] Address:  [place], [place],  [address] Phone: [phone]        Great to see you today.  I have refilled the medication(s) we provide.   If labs were collected, we will inform you of lab results once received either by echart message or telephone call.   - echart message- for normal results that have been seen by the patient already.   - telephone call: abnormal results or if patient has not viewed results in their echart.   Health Maintenance After Age 83 After age 52, you are at a higher risk for certain long-term diseases and infections as well as injuries from falls. Falls are a major cause of broken bones and head injuries in people who are older than age 41. Getting regular preventive care can help to keep you healthy and well. Preventive care includes getting regular testing and making lifestyle changes as recommended by your health care provider. Talk with your health care provider about: Which screenings and tests you should have. A screening is a test that checks for a disease when you have no symptoms. A diet and exercise plan that is right for you. What should I know about screenings and tests to prevent falls? Screening and testing are the best ways to find a health problem early. Early diagnosis and treatment give you the best chance of managing medical conditions that are common after age 28. Certain conditions and lifestyle choices may make you more likely to have a fall. Your health care provider may recommend: Regular vision checks. Poor vision and conditions such as cataracts can make you more likely to have a fall. If you wear glasses, make sure to get your prescription updated if your vision changes. Medicine review. Work with your health care provider to regularly review all of the medicines you are taking, including  over-the-counter medicines. Ask your health care provider about any side effects that may make you more likely to have a fall. Tell your health care provider if any medicines that you take make you feel dizzy or sleepy. Strength and balance checks. Your health care provider may recommend certain tests to check your strength and balance while standing, walking, or changing positions. Foot health exam. Foot pain and numbness, as well as not wearing proper footwear, can make you more likely to have a fall. Screenings, including: Osteoporosis screening. Osteoporosis is a condition that causes the bones to get weaker and break more easily. Blood pressure screening. Blood pressure changes and medicines to control blood pressure can make you feel dizzy. Depression screening. You may be more likely to have a fall if you have a fear of falling, feel depressed, or feel unable to do activities that you used to do. Alcohol use screening. Using too much alcohol can affect your balance and may make you more likely to have a fall. Follow these instructions at home: Lifestyle Do not drink alcohol if: Your health care provider tells you not to drink. If you drink alcohol: Limit how much you have to: 0-1 drink a day for women. 0-2 drinks a day for men. Know how much alcohol is in your drink. In the [place] one drink equals one 12 oz bottle of beer (355 mL), one 5 oz glass of wine (148 mL), or one 1 oz glass of hard liquor (44 mL). Do not use any products that contain nicotine  or tobacco. These products include cigarettes, chewing tobacco, and vaping devices, such as e-cigarettes. If you need help quitting, ask your health care provider. Activity  Follow a regular exercise program to stay fit. This will help you maintain your balance. Ask your health care provider what types of exercise are appropriate for you. If you need a cane or walker, use it as recommended by your health care provider. Wear supportive shoes  that have nonskid soles. Safety  Remove any tripping hazards, such as rugs, cords, and clutter. Install safety equipment such as grab bars in bathrooms and safety rails on stairs. Keep rooms and walkways well-lit. General instructions Talk with your health care provider about your risks for falling. Tell your health care provider if: You fall. Be sure to tell your health care provider about all falls, even ones that seem minor. You feel dizzy, tiredness (fatigue), or off-balance. Take over-the-counter and prescription medicines only as told by your health care provider. These include supplements. Eat a healthy diet and maintain a healthy weight. A healthy diet includes low-fat dairy products, low-fat (lean) meats, and fiber from whole grains, beans, and lots of fruits and vegetables. Stay current with your vaccines. Schedule regular health, dental, and eye exams. Summary Having a healthy lifestyle and getting preventive care can help to protect your health and wellness after age 4. Screening and testing are the best way to find a health problem early and help you avoid having a fall. Early diagnosis and treatment give you the best chance for managing medical conditions that are more common for people who are older than age 64. Falls are a major cause of broken bones and head injuries in people who are older than age 53. Take precautions to prevent a fall at home. Work with your health care provider to learn what changes you can make to improve your health and wellness and to prevent falls. This information is not intended to replace advice given to you by your health care provider. Make sure you discuss any questions you have with your health care provider. Document Revised: 06/08/2020 Document Reviewed: 06/08/2020 Elsevier Patient Education  [place].

## 2022-01-27 ENCOUNTER — Encounter: Payer: Self-pay | Admitting: Family Medicine

## 2022-02-20 IMAGING — DX DG CHEST 2V
2 series · 2 of 2 positions shown · non-contrast
Comparison: 08/02/2018.

CLINICAL DATA: COVID positive for 10 days.

EXAM:
CHEST - 2 VIEW

[chest pa]
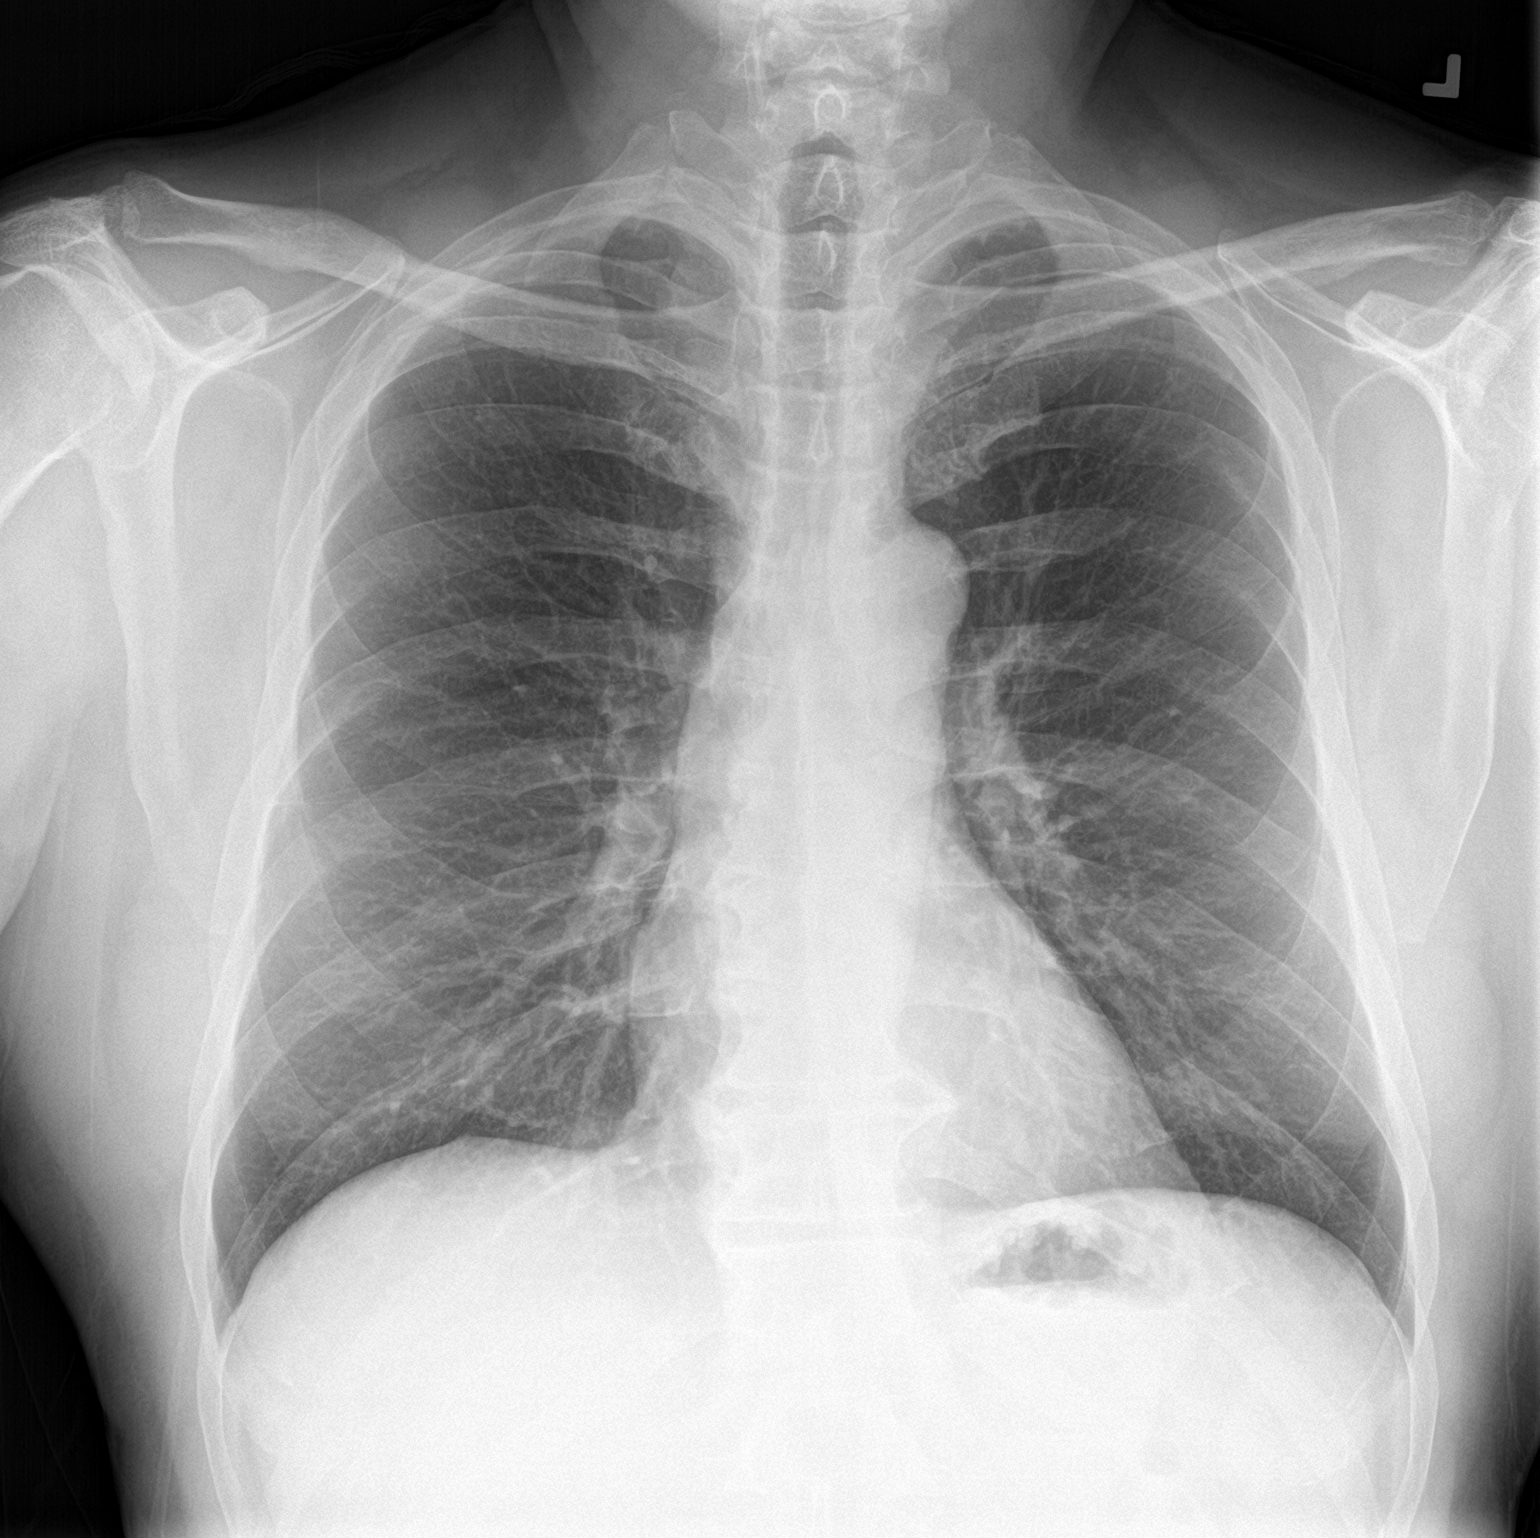

[chest lat]
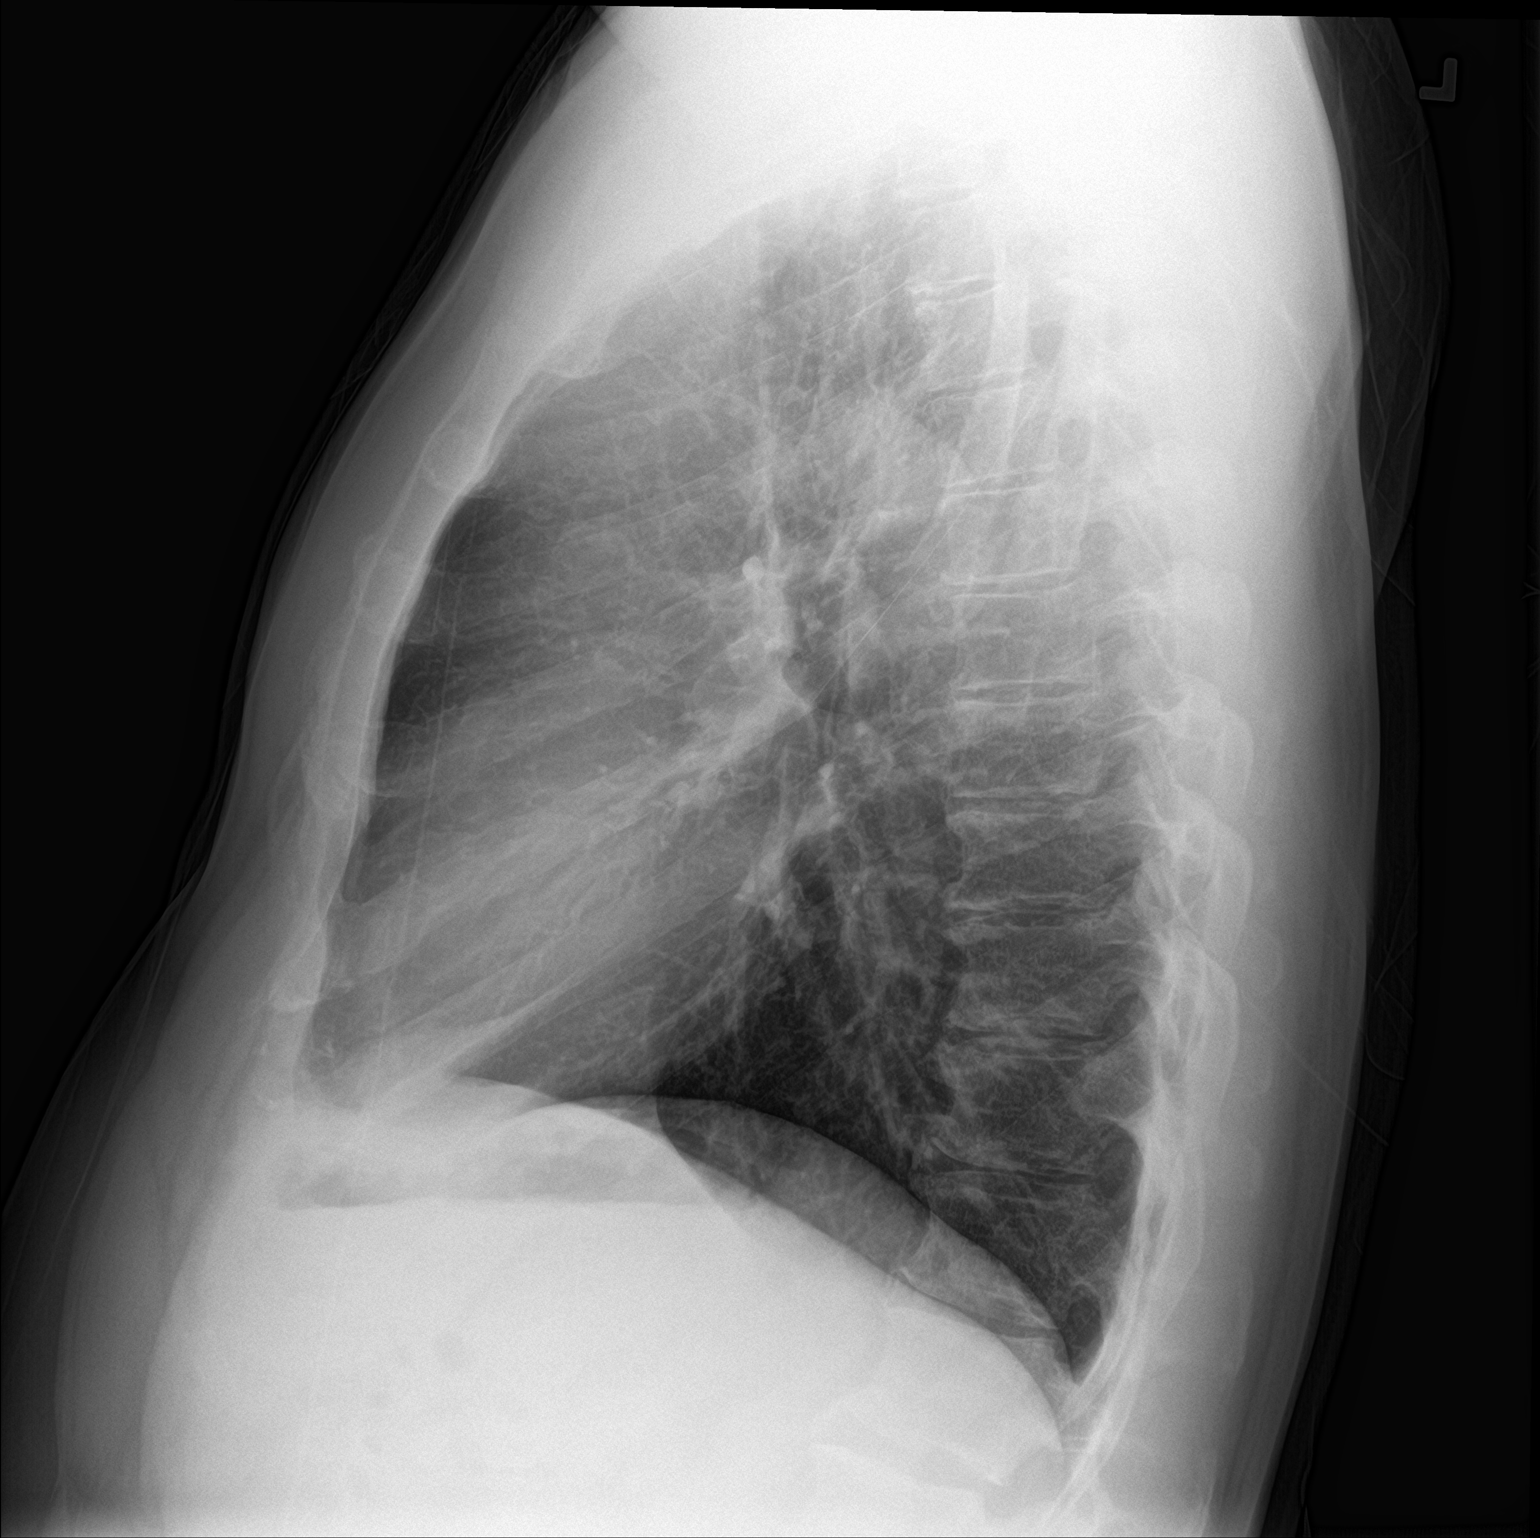

[2 of 2 positions shown; findings below may reference images not displayed]

FINDINGS: The heart size and mediastinal contours are within normal limits. No
consolidation. Similar biapical pleuroparenchymal scarring no
visible pleural effusions or pneumothorax. No acute osseous
abnormality.
IMPRESSION: No active cardiopulmonary disease.

## 2022-03-07 DIAGNOSIS — R051 Acute cough: Secondary | ICD-10-CM | POA: Diagnosis not present

## 2022-03-07 DIAGNOSIS — U071 COVID-19: Secondary | ICD-10-CM | POA: Diagnosis not present

## 2022-05-03 NOTE — Telephone Encounter (Signed)
LVM for pt to return GI call to schedule. Office number provided

## 2022-05-09 ENCOUNTER — Encounter: Payer: Self-pay | Admitting: Gastroenterology

## 2022-05-19 ENCOUNTER — Ambulatory Visit (AMBULATORY_SURGERY_CENTER): Payer: BC Managed Care – PPO

## 2022-05-19 VITALS — Ht 73.0 in | Wt 210.0 lb

## 2022-05-19 DIAGNOSIS — Z86010 Personal history of colonic polyps: Secondary | ICD-10-CM

## 2022-05-19 MED ORDER — NA SULFATE-K SULFATE-MG SULF 17.5-3.13-1.6 GM/177ML PO SOLN: 1.0000 | Freq: Once | ORAL | 0 refills | Status: AC

## 2022-05-19 NOTE — Progress Notes (Signed)
No egg or soy allergy known to patient  No issues known to pt with past sedation with any surgeries or procedures Patient denies ever being told they had issues or difficulty with intubation  No FH of Malignant Hyperthermia Pt is not on diet pills Pt is not on  home 02  Pt is not on blood thinners  Pt denies issues with constipation  No A fib or A flutter Have any cardiac testing pending--no Pt instructed to use Singlecare.com or GoodRx for a price reduction on prep   

## 2022-06-04 DIAGNOSIS — J209 Acute bronchitis, unspecified: Secondary | ICD-10-CM | POA: Diagnosis not present

## 2022-06-07 ENCOUNTER — Encounter: Payer: Self-pay | Admitting: Gastroenterology

## 2022-06-16 ENCOUNTER — Encounter: Payer: Self-pay | Admitting: Gastroenterology

## 2022-06-16 ENCOUNTER — Ambulatory Visit (AMBULATORY_SURGERY_CENTER): Payer: BC Managed Care – PPO | Admitting: Gastroenterology

## 2022-06-16 VITALS — BP 99/54 | HR 52 | Temp 97.8°F | Resp 11 | Ht 73.0 in | Wt 210.0 lb

## 2022-06-16 DIAGNOSIS — D122 Benign neoplasm of ascending colon: Secondary | ICD-10-CM | POA: Diagnosis not present

## 2022-06-16 DIAGNOSIS — Z86010 Personal history of colonic polyps: Secondary | ICD-10-CM | POA: Diagnosis not present

## 2022-06-16 DIAGNOSIS — Z09 Encounter for follow-up examination after completed treatment for conditions other than malignant neoplasm: Secondary | ICD-10-CM | POA: Diagnosis not present

## 2022-06-16 DIAGNOSIS — Z1211 Encounter for screening for malignant neoplasm of colon: Secondary | ICD-10-CM | POA: Diagnosis not present

## 2022-06-16 DIAGNOSIS — K635 Polyp of colon: Secondary | ICD-10-CM | POA: Diagnosis not present

## 2022-06-16 MED ORDER — SODIUM CHLORIDE 0.9 % IV SOLN: 500.0000 mL | Freq: Once | INTRAVENOUS | Status: DC

## 2022-06-16 NOTE — Progress Notes (Signed)
[facility] History and Physical   Primary Care Physician:  Natalia Leatherwood, DO   Reason for Procedure:   History of colon polyps  Plan:    colonoscopy     HPI: Warren Hall. is a 57 y.o. male  here for colonoscopy surveillance - sessile serrated polyp removed 12/2016.   Patient denies any bowel symptoms at this time. No family history of colon cancer known. Otherwise feels well without any cardiopulmonary symptoms.   I have discussed risks / benefits of anesthesia and endoscopic procedure with Warren Hall. and they wish to proceed with the exams as outlined today.    Past Medical History:  Diagnosis Date   Allergy    Seasonal   COVID-19 07/01/2020   Frequent headaches    Hyperlipidemia    Lumbar herniated disc 2001   conservative treatment   Migraines    Seasonal asthma     Past Surgical History:  Procedure Laterality Date   ACHILLES TENDON REPAIR     LASIK     SHOULDER SURGERY      Prior to Admission medications   Medication Sig Start Date End Date Taking? Authorizing Provider  atorvastatin (LIPITOR) 80 MG tablet Take 1 tablet (80 mg total) by mouth daily. 01/17/22  Yes Kuneff, Renee A, DO  B Complex-C (B-COMPLEX WITH VITAMIN C) tablet Take 1 tablet by mouth daily.   Yes [provider]  cholecalciferol (VITAMIN D3) 25 MCG (1000 UT) tablet Take 1,000 Units by mouth daily.   Yes [provider]  Cyanocobalamin (B-12) 1000 MCG CAPS Take 1 tablet by mouth daily.   Yes [provider]  fluticasone (FLONASE) 50 MCG/ACT nasal spray Place 2 sprays into both nostrils daily. 01/17/22  Yes Kuneff, Renee A, DO  omeprazole (PRILOSEC) 40 MG capsule Take 1 capsule (40 mg total) by mouth daily. 01/17/22  Yes Kuneff, Renee A, DO  SUMAtriptan (IMITREX) 100 MG tablet May repeat in 2 hours if headache persists or recurs. 01/17/22  Yes Kuneff, Renee A, DO  ZYRTEC ALLERGY 10 MG tablet Take 1 tablet (10 mg total) by mouth daily. 01/17/22   Yes Kuneff, Renee A, DO  albuterol (VENTOLIN HFA) 108 (90 Base) MCG/ACT inhaler Inhale 2 puffs into the lungs every 4 (four) hours as needed for wheezing. 01/17/22   Felix Pacini A, DO    Current Outpatient Medications  Medication Sig Dispense Refill   atorvastatin (LIPITOR) 80 MG tablet Take 1 tablet (80 mg total) by mouth daily. 90 tablet 3   B Complex-C (B-COMPLEX WITH VITAMIN C) tablet Take 1 tablet by mouth daily.     cholecalciferol (VITAMIN D3) 25 MCG (1000 UT) tablet Take 1,000 Units by mouth daily.     Cyanocobalamin (B-12) 1000 MCG CAPS Take 1 tablet by mouth daily.     fluticasone (FLONASE) 50 MCG/ACT nasal spray Place 2 sprays into both nostrils daily. 16 g 6   omeprazole (PRILOSEC) 40 MG capsule Take 1 capsule (40 mg total) by mouth daily. 90 capsule 3   SUMAtriptan (IMITREX) 100 MG tablet May repeat in 2 hours if headache persists or recurs. 15 tablet 11   ZYRTEC ALLERGY 10 MG tablet Take 1 tablet (10 mg total) by mouth daily. 90 tablet 3   albuterol (VENTOLIN HFA) 108 (90 Base) MCG/ACT inhaler Inhale 2 puffs into the lungs every 4 (four) hours as needed for wheezing. 1 each 11   Current Facility-Administered Medications  Medication Dose Route Frequency Provider Last Rate  Last Admin   0.9 %  sodium chloride infusion  500 mL Intravenous Once [name], [name], MD        Allergies as of 06/16/2022   (No Known Allergies)    Family History  Problem Relation Age of Onset   COPD Mother    Hypertension Father    Colon polyps Father    COPD Maternal Grandmother    Multiple sclerosis Maternal Grandfather    Cancer Paternal Grandfather        lung   Colon cancer Neg Hx    Esophageal cancer Neg Hx    Rectal cancer Neg Hx    Stomach cancer Neg Hx     Social History   Socioeconomic History   Marital status: Married    Spouse name: Not on file   Number of children: 2   Years of education: Bachelor D   Highest education level: Bachelor's degree (e.g., BA, AB, BS)   Occupational History   Occupation: Investment banker, corporate: [employer]    Employer: [employer]  Tobacco Use   Smoking status: Former   Smokeless tobacco: Never   Tobacco comments:    on and off 30 years ago  Vaping Use   Vaping Use: Never used  Substance and Sexual Activity   Alcohol use: Yes    Comment: weekly   Drug use: No   Sexual activity: Yes  Other Topics Concern   Not on file  Social History Narrative   [name] lives with his daughter. He works Teacher, English as a foreign language.   Social Determinants of Health   Financial Resource Strain: Low Risk  (01/11/2021)   Overall Financial Resource Strain (CARDIA)    Difficulty of Paying Living Expenses: Not hard at all  Food Insecurity: No Food Insecurity (01/11/2021)   Hunger Vital Sign    Worried About Running Out of Food in the Last Year: Never true    Ran Out of Food in the Last Year: Never true  Transportation Needs: No Transportation Needs (01/11/2021)   PRAPARE - Administrator, Civil Service (Medical): No    Lack of Transportation (Non-Medical): No  Physical Activity: Insufficiently Active (01/11/2021)   Exercise Vital Sign    Days of Exercise per Week: 3 days    Minutes of Exercise per Session: 40 min  Stress: No Stress Concern Present (01/11/2021)   [employer] of Occupational Health - Occupational Stress Questionnaire    Feeling of Stress : Not at all  Social Connections: Moderately Integrated (01/11/2021)   Social Connection and Isolation Panel [NHANES]    Frequency of Communication with Friends and Family: More than three times a week    Frequency of Social Gatherings with Friends and Family: More than three times a week    Attends Religious Services: More than 4 times per year    Active Member of [employer] or Organizations: No    Attends Engineer, structural: Not on file    Marital Status: Married  Catering manager Violence: Not on file    Review of Systems: All other review of systems  negative except as mentioned in the HPI.  Physical Exam: Vital signs BP 125/80   Pulse 60   Temp 97.8 F (36.6 C)   Ht 6\' 1"  (1.854 m)   Wt 210 lb (95.3 kg)   SpO2 96%   BMI 27.71 kg/m   General:   Alert,  Well-developed, pleasant and cooperative in NAD Lungs:  Clear throughout to auscultation.  Heart:  Regular rate and rhythm Abdomen:  Soft, nontender and nondistended.   Neuro/Psych:  Alert and cooperative. Normal mood and affect. A and O x 3  Harlin Rain, MD [facility]

## 2022-06-16 NOTE — Progress Notes (Signed)
Vss nad trans to pacu 

## 2022-06-16 NOTE — Progress Notes (Signed)
Pt's states no medical or surgical changes since previsit or office visit. 

## 2022-06-16 NOTE — Op Note (Signed)
[facility] Patient Name: Warren Hall Procedure Date: 06/16/2022 8:29 AM MRN: [id number] Endoscopist: Viviann Spare P. Adela Lank , MD, [id number] Age: 57 Referring MD:  Date of Birth: 06-06-1965 Gender: Male Account #: [id number] Procedure:                Colonoscopy Indications:              High risk colon cancer surveillance: Personal                            history of colonic polyps - last exam 12/2016 -                            sessile serrated polyp removed Medicines:                Monitored Anesthesia Care Procedure:                Pre-Anesthesia Assessment:                           - Prior to the procedure, a History and Physical                            was performed, and patient medications and                            allergies were reviewed. The patient's tolerance of                            previous anesthesia was also reviewed. The risks                            and benefits of the procedure and the sedation                            options and risks were discussed with the patient.                            All questions were answered, and informed consent                            was obtained. Prior Anticoagulants: The patient has                            taken no anticoagulant or antiplatelet agents. ASA                            Grade Assessment: II - A patient with mild systemic                            disease. After reviewing the risks and benefits,                            the patient was deemed in satisfactory condition to  undergo the procedure.                           After obtaining informed consent, the colonoscope                            was passed under direct vision. Throughout the                            procedure, the patient's blood pressure, pulse, and                            oxygen saturations were monitored continuously. The                            CF [id number] #[id number] was  introduced through the anus                            and advanced to the the cecum, identified by                            appendiceal orifice and ileocecal valve. The                            colonoscopy was performed without difficulty. The                            patient tolerated the procedure well. The quality                            of the bowel preparation was good. The ileocecal                            valve, appendiceal orifice, and rectum were                            photographed. Scope In: 8:33:04 AM Scope Out: 8:46:42 AM Scope Withdrawal Time: 0 hours 11 minutes 30 seconds  Total Procedure Duration: 0 hours 13 minutes 38 seconds  Findings:                 The perianal and digital rectal examinations were                            normal.                           A few small-mouthed diverticula were found in the                            sigmoid colon.                           A 5 mm polyp was found in the ascending colon. The  polyp was flat. The polyp was removed with a cold                            snare. Resection and retrieval were complete.                           A 2 to 3 mm polyp was found in the ascending colon.                            The polyp was sessile. The polyp was removed with a                            cold snare. Resection and retrieval were complete.                           Internal hemorrhoids were found during                            retroflexion. The hemorrhoids were small.                           The exam was otherwise without abnormality. Complications:            No immediate complications. Estimated blood loss:                            Minimal. Estimated Blood Loss:     Estimated blood loss was minimal. Impression:               - Diverticulosis in the sigmoid colon.                           - One 5 mm polyp in the ascending colon, removed                            with a cold  snare. Resected and retrieved.                           - One 2 to 3 mm polyp in the ascending colon,                            removed with a cold snare. Resected and retrieved.                           - Internal hemorrhoids.                           - The examination was otherwise normal.                           - The GI Genius (intelligent endoscopy module),                            computer-aided polyp detection system powered by AI  was utilized to detect colorectal polyps through                            enhanced visualization during colonoscopy. Recommendation:           - Patient has a contact number available for                            emergencies. The signs and symptoms of potential                            delayed complications were discussed with the                            patient. Return to normal activities tomorrow.                            Written discharge instructions were provided to the                            patient.                           - Resume previous diet.                           - Continue present medications.                           - Await pathology results. Viviann Spare P. Kieryn Burtis, MD 06/16/2022 8:50:31 AM This report has been signed electronically.

## 2022-06-16 NOTE — Patient Instructions (Signed)
Handouts Provided:  Polyps and Diverticulosis  YOU HAD AN ENDOSCOPIC PROCEDURE TODAY AT THE [facility]:   Refer to the procedure report that was given to you for any specific questions about what was found during the examination.  If the procedure report does not answer your questions, please call your gastroenterologist to clarify.  If you requested that your care partner not be given the details of your procedure findings, then the procedure report has been included in a sealed envelope for you to review at your convenience later.  YOU SHOULD EXPECT: Some feelings of bloating in the abdomen. Passage of more gas than usual.  Walking can help get rid of the air that was put into your GI tract during the procedure and reduce the bloating. If you had a lower endoscopy (such as a colonoscopy or flexible sigmoidoscopy) you may notice spotting of blood in your stool or on the toilet paper. If you underwent a bowel prep for your procedure, you may not have a normal bowel movement for a few days.  Please Note:  You might notice some irritation and congestion in your nose or some drainage.  This is from the oxygen used during your procedure.  There is no need for concern and it should clear up in a day or so.  SYMPTOMS TO REPORT IMMEDIATELY:  Following lower endoscopy (colonoscopy or flexible sigmoidoscopy):  Excessive amounts of blood in the stool  Significant tenderness or worsening of abdominal pains  Swelling of the abdomen that is new, acute  Fever of 100F or higher  For urgent or emergent issues, a gastroenterologist can be reached at any hour by calling [phone]. Do not use MyChart messaging for urgent concerns.    DIET:  We do recommend a small meal at first, but then you may proceed to your regular diet.  Drink plenty of fluids but you should avoid alcoholic beverages for 24 hours.  ACTIVITY:  You should plan to take it easy for the rest of today and you should NOT DRIVE  or use heavy machinery until tomorrow (because of the sedation medicines used during the test).    FOLLOW UP: Our staff will call the number listed on your records the next business day following your procedure.  We will call around 7:15- 8:00 am to check on you and address any questions or concerns that you may have regarding the information given to you following your procedure. If we do not reach you, we will leave a message.     If any biopsies were taken you will be contacted by phone or by letter within the next 1-3 weeks.  Please call us at [phone] if you have not heard about the biopsies in 3 weeks.    SIGNATURES/CONFIDENTIALITY: You and/or your care partner have signed paperwork which will be entered into your electronic medical record.  These signatures attest to the fact that that the information above on your After Visit Summary has been reviewed and is understood.  Full responsibility of the confidentiality of this discharge information lies with you and/or your care-partner.  

## 2022-06-17 ENCOUNTER — Telehealth: Payer: Self-pay | Admitting: *Deleted

## 2022-06-17 NOTE — Telephone Encounter (Signed)
  Follow up Call-     06/16/2022    7:43 AM  Call back number  Post procedure Call Back phone  # [phone]  Permission to leave phone message Yes     Patient questions:  Do you have a fever, pain , or abdominal swelling? No. Pain Score  0 *  Have you tolerated food without any problems? Yes.    Have you been able to return to your normal activities? Yes.    Do you have any questions about your discharge instructions: Diet   No. Medications  No. Follow up visit  No.  Do you have questions or concerns about your Care? No.  Actions: * If pain score is 4 or above: No action needed, pain <4.

## 2022-07-04 ENCOUNTER — Encounter: Payer: Self-pay | Admitting: Gastroenterology

## 2022-11-01 DIAGNOSIS — M1611 Unilateral primary osteoarthritis, right hip: Secondary | ICD-10-CM

## 2022-11-01 HISTORY — DX: Unilateral primary osteoarthritis, right hip: M16.11

## 2022-11-04 ENCOUNTER — Telehealth: Payer: Self-pay

## 2022-11-04 NOTE — Telephone Encounter (Signed)
Surgical clearance forms received on 11/03/22. Patient has been scheduled on 11/11 for surgical clearance appt. Forms have been placed in PCP office.

## 2022-11-18 ENCOUNTER — Encounter: Payer: Self-pay | Admitting: Family Medicine

## 2022-11-18 ENCOUNTER — Ambulatory Visit: Payer: BC Managed Care – PPO | Admitting: Family Medicine

## 2022-11-18 ENCOUNTER — Telehealth: Payer: Self-pay | Admitting: Family Medicine

## 2022-11-18 VITALS — BP 120/82 | HR 60 | Temp 98.0°F | Wt 221.0 lb

## 2022-11-18 DIAGNOSIS — Z01818 Encounter for other preprocedural examination: Secondary | ICD-10-CM | POA: Diagnosis not present

## 2022-11-18 DIAGNOSIS — M1611 Unilateral primary osteoarthritis, right hip: Secondary | ICD-10-CM | POA: Diagnosis not present

## 2022-11-18 DIAGNOSIS — Z131 Encounter for screening for diabetes mellitus: Secondary | ICD-10-CM

## 2022-11-18 DIAGNOSIS — Z23 Encounter for immunization: Secondary | ICD-10-CM

## 2022-11-18 LAB — COMPREHENSIVE METABOLIC PANEL
ALT: 20 U/L (ref 0–53)
AST: 21 U/L (ref 0–37)
Albumin: 4.4 g/dL (ref 3.5–5.2)
Alkaline Phosphatase: 53 U/L (ref 39–117)
BUN: 20 mg/dL (ref 6–23)
CO2: 31 meq/L (ref 19–32)
Calcium: 9.8 mg/dL (ref 8.4–10.5)
Chloride: 102 meq/L (ref 96–112)
Creatinine, Ser: 1.07 mg/dL (ref 0.40–1.50)
GFR: 77.69 mL/min (ref >60.00)
Glucose, Bld: 87 mg/dL (ref 70–99)
Potassium: 4.6 meq/L (ref 3.5–5.1)
Sodium: 140 meq/L (ref 135–145)
Total Bilirubin: 0.8 mg/dL (ref 0.2–1.2)
Total Protein: 6.6 g/dL (ref 6.0–8.3)

## 2022-11-18 LAB — CBC
HCT: 45.3 % (ref 39.0–52.0)
Hemoglobin: 14.9 g/dL (ref 13.0–17.0)
MCHC: 32.8 g/dL (ref 30.0–36.0)
MCV: 97.4 fL (ref 78.0–100.0)
Platelets: 304 10*3/uL (ref 150.0–400.0)
RBC: 4.66 Mil/uL (ref 4.22–5.81)
RDW: 12.8 % (ref 11.5–15.5)
WBC: 7.2 10*3/uL (ref 4.0–10.5)

## 2022-11-18 LAB — HEMOGLOBIN A1C: Hgb A1c MFr Bld: 5.4 % (ref 4.6–6.5)

## 2022-11-18 NOTE — Progress Notes (Signed)
Warren Hall , [birth year], 57 y.o., male MRN: [id number] Patient Care Team    Relationship Specialty Notifications Start End  Natalia Leatherwood, DO PCP - General Family Medicine  09/29/14   Letta Kocher, MD  Rehabilitation  12/09/16   Benancio Deeds, MD Consulting Physician Gastroenterology  01/17/18     Chief Complaint  Patient presents with   surgical clearance     Subjective: Warren Hall. is a 57 y.o. Pt presents for an OV to complete preoperative risk assessment Procedure:Right Total hip arthroplasty Indication: OA Anesthesia: Spinal- LMAC Surgery type risk:   - Intermediate risk= orthopedics Prior anesthesia complications:none Family history of prior anesthesia complications:none Cardiac:    - CHD, PAD, stroke, MI, aortic stenosis > no history.    - METs: > 4   - EKG: Not indicated  - If known heart disease or symptoms.  - High risk surgery Pulmonary: COPD, smoker, asthma, sleep apnea, dyspnea> seasonal asthma Endocrine: No prior history of endocrine disorder Body mass index is 29.16 kg/m. Chronic kidney disease: No prior history of kidney disease Chronic med that needs to be continued: Omeprazole, atorvastatin Anticoagulation: None     01/17/2022    1:03 PM 01/15/2021    1:05 PM 01/09/2020   10:36 AM 01/17/2018    1:15 PM 12/08/2016   10:06 AM  Depression screen PHQ 2/9  Decreased Interest 0 0 0 0 0  Down, Depressed, Hopeless 0 0 0 0 0  PHQ - 2 Score 0 0 0 0 0    No Known Allergies Social History   Social History Narrative   Mr Ngo lives with his daughter. He works Teacher, English as a foreign language.   Past Medical History:  Diagnosis Date   Allergy    Seasonal   COVID-19 07/01/2020   Frequent headaches    Hyperlipidemia    Lumbar herniated disc 2001   conservative treatment   Migraines    Seasonal asthma    Past Surgical History:  Procedure Laterality Date   ACHILLES TENDON REPAIR     LASIK     SHOULDER SURGERY     Family History  Problem  Relation Age of Onset   COPD Mother    Hypertension Father    Colon polyps Father    COPD Maternal Grandmother    Multiple sclerosis Maternal Grandfather    Cancer Paternal Grandfather        lung   Colon cancer Neg Hx    Esophageal cancer Neg Hx    Rectal cancer Neg Hx    Stomach cancer Neg Hx    Allergies as of 11/18/2022   No Known Allergies      Medication List        Accurate as of November 18, 2022  5:00 PM. If you have any questions, ask your nurse or doctor.          albuterol 108 (90 Base) MCG/ACT inhaler Commonly known as: VENTOLIN HFA Inhale 2 puffs into the lungs every 4 (four) hours as needed for wheezing.   atorvastatin 80 MG tablet Commonly known as: LIPITOR Take 1 tablet (80 mg total) by mouth daily.   B-12 1000 MCG Caps Take 1 tablet by mouth daily.   B-complex with vitamin C tablet Take 1 tablet by mouth daily.   cholecalciferol 25 MCG (1000 UNIT) tablet Commonly known as: VITAMIN D3 Take 1,000 Units by mouth daily.   fluticasone 50 MCG/ACT nasal spray Commonly known as: FLONASE  Place 2 sprays into both nostrils daily.   omeprazole 40 MG capsule Commonly known as: PRILOSEC Take 1 capsule (40 mg total) by mouth daily.   SUMAtriptan 100 MG tablet Commonly known as: IMITREX May repeat in 2 hours if headache persists or recurs.   ZyrTEC Allergy 10 MG tablet Generic drug: cetirizine Take 1 tablet (10 mg total) by mouth daily.        All past medical history, surgical history, allergies, family history, immunizations andmedications were updated in the EMR today and reviewed under the history and medication portions of their EMR.     ROS Negative, with the exception of above mentioned in HPI   Objective:  BP 120/82   Pulse 60   Temp 98 F (36.7 C)   Wt 221 lb (100.2 kg)   SpO2 96%   BMI 29.16 kg/m  Body mass index is 29.16 kg/m. Physical Exam Vitals and nursing note reviewed.  Constitutional:      General: He is not in  acute distress.    Appearance: Normal appearance. He is not ill-appearing, toxic-appearing or diaphoretic.  HENT:     Head: Normocephalic and atraumatic.  Eyes:     General: No scleral icterus.       Right eye: No discharge.        Left eye: No discharge.     Extraocular Movements: Extraocular movements intact.     Pupils: Pupils are equal, round, and reactive to light.  Cardiovascular:     Rate and Rhythm: Normal rate and regular rhythm.  Pulmonary:     Effort: Pulmonary effort is normal. No respiratory distress.     Breath sounds: Normal breath sounds. No wheezing, rhonchi or rales.  Musculoskeletal:     Right lower leg: No edema.     Left lower leg: No edema.  Skin:    General: Skin is warm.     Findings: No rash.  Neurological:     Mental Status: He is alert and oriented to person, place, and time. Mental status is at baseline.  Psychiatric:        Mood and Affect: Mood normal.        Behavior: Behavior normal.        Thought Content: Thought content normal.        Judgment: Judgment normal.    No results found. No results found. Results for orders placed or performed in visit on 11/18/22 (from the past 24 hour(s))  CBC     Status: None   Collection Time: 11/18/22  8:16 AM  Result Value Ref Range   WBC 7.2 4.0 - 10.5 K/uL   RBC 4.66 4.22 - 5.81 Mil/uL   Platelets 304.0 150.0 - 400.0 K/uL   Hemoglobin 14.9 13.0 - 17.0 g/dL   HCT 40.9 81.1 - 91.4 %   MCV 97.4 78.0 - 100.0 fl   MCHC 32.8 30.0 - 36.0 g/dL   RDW 78.2 95.6 - 21.3 %  Hemoglobin A1c     Status: None   Collection Time: 11/18/22  8:16 AM  Result Value Ref Range   Hgb A1c MFr Bld 5.4 4.6 - 6.5 %  Comp Met (CMET)     Status: None   Collection Time: 11/18/22  8:16 AM  Result Value Ref Range   Sodium 140 135 - 145 mEq/L   Potassium 4.6 3.5 - 5.1 mEq/L   Chloride 102 96 - 112 mEq/L   CO2 31 19 - 32 mEq/L   Glucose, Bld  87 70 - 99 mg/dL   BUN 20 6 - 23 mg/dL   Creatinine, Ser 4.78 0.40 - 1.50 mg/dL    Total Bilirubin 0.8 0.2 - 1.2 mg/dL   Alkaline Phosphatase 53 39 - 117 U/L   AST 21 0 - 37 U/L   ALT 20 0 - 53 U/L   Total Protein 6.6 6.0 - 8.3 g/dL   Albumin 4.4 3.5 - 5.2 g/dL   GFR 29.56 >21.30 mL/min   Calcium 9.8 8.4 - 10.5 mg/dL    Assessment/Plan: Warren Hall. is a 57 y.o. male present for OV for  Influenza vaccine needed - Flu vaccine trivalent PF, 6mos and older(Flulaval,Afluria,Fluarix,Fluzone) Diabetes mellitus screening - Hemoglobin A1c- WNL Primary osteoarthritis of right hip/preoperative clearance - CBC-WNL - Hemoglobin A1c- WNL - Comp Met (CMET)-WNL Body mass index is 29.16 kg/m. Avoid NSAID prior to procedure, per orthopedic team instruction.  Patient understands the purpose of preoperative visit is to attempt to minimize surgical complications and communicate to surgical team chronic conditions and management. No patient is free of risk when undergoing a procedure. The decision about whether to proceed with the operation belongs to the surgeon and the patient. Patient is of low risk of complications and cleared   Reviewed expectations re: course of current medical issues. Discussed self-management of symptoms. Outlined signs and symptoms indicating need for more acute intervention. Patient verbalized understanding and all questions were answered. Patient received an After-Visit Summary.    Orders Placed This Encounter  Procedures   Flu vaccine trivalent PF, 6mos and older(Flulaval,Afluria,Fluarix,Fluzone)   CBC   Hemoglobin A1c   Comp Met (CMET)   No orders of the defined types were placed in this encounter.  Referral Orders  No referral(s) requested today     Note is dictated utilizing voice recognition software. Although note has been proof read prior to signing, occasional typographical errors still can be missed. If any questions arise, please do not hesitate to call for verification.   electronically signed by:  Felix Pacini, DO   [facility] [place]

## 2022-11-18 NOTE — Telephone Encounter (Signed)
Surgical risk assessment completed.  Please fax to surgeon along with office visit note and lab results.

## 2022-11-18 NOTE — Patient Instructions (Addendum)

## 2022-11-21 NOTE — Telephone Encounter (Signed)
Faxed

## 2022-12-12 ENCOUNTER — Ambulatory Visit: Payer: BC Managed Care – PPO | Admitting: Family Medicine

## 2022-12-26 DIAGNOSIS — M1611 Unilateral primary osteoarthritis, right hip: Secondary | ICD-10-CM | POA: Diagnosis not present

## 2023-01-10 ENCOUNTER — Other Ambulatory Visit: Payer: Self-pay | Admitting: Family Medicine

## 2023-01-13 DIAGNOSIS — M25751 Osteophyte, right hip: Secondary | ICD-10-CM | POA: Diagnosis not present

## 2023-01-13 DIAGNOSIS — M1611 Unilateral primary osteoarthritis, right hip: Secondary | ICD-10-CM | POA: Diagnosis not present

## 2023-01-20 ENCOUNTER — Encounter: Payer: Self-pay | Admitting: Family Medicine

## 2023-01-20 ENCOUNTER — Ambulatory Visit: Payer: BC Managed Care – PPO | Admitting: Family Medicine

## 2023-01-20 VITALS — BP 138/80 | HR 75 | Ht 73.0 in | Wt 217.2 lb

## 2023-01-20 DIAGNOSIS — Z0000 Encounter for general adult medical examination without abnormal findings: Secondary | ICD-10-CM

## 2023-01-20 DIAGNOSIS — G43009 Migraine without aura, not intractable, without status migrainosus: Secondary | ICD-10-CM

## 2023-01-20 DIAGNOSIS — K219 Gastro-esophageal reflux disease without esophagitis: Secondary | ICD-10-CM | POA: Diagnosis not present

## 2023-01-20 DIAGNOSIS — Z131 Encounter for screening for diabetes mellitus: Secondary | ICD-10-CM

## 2023-01-20 DIAGNOSIS — Z125 Encounter for screening for malignant neoplasm of prostate: Secondary | ICD-10-CM

## 2023-01-20 DIAGNOSIS — E785 Hyperlipidemia, unspecified: Secondary | ICD-10-CM

## 2023-01-20 DIAGNOSIS — J45998 Other asthma: Secondary | ICD-10-CM

## 2023-01-20 LAB — PSA: PSA: 3.04 ng/mL (ref 0.10–4.00)

## 2023-01-20 LAB — LIPID PANEL
Cholesterol: 153 mg/dL (ref 0–200)
HDL: 32.6 mg/dL — ABNORMAL LOW (ref >39.00)
LDL Cholesterol: 96 mg/dL (ref 0–99)
NonHDL: 120.89
Total CHOL/HDL Ratio: 5
Triglycerides: 124 mg/dL (ref 0.0–149.0)
VLDL: 24.8 mg/dL (ref 0.0–40.0)

## 2023-01-20 LAB — TSH: TSH: 1.12 u[IU]/mL (ref 0.35–5.50)

## 2023-01-20 MED ORDER — TIZANIDINE HCL 4 MG PO TABS: 2.0000 mg | ORAL_TABLET | Freq: Every day | ORAL | 1 refills | Status: DC

## 2023-01-20 MED ORDER — ALBUTEROL SULFATE HFA 108 (90 BASE) MCG/ACT IN AERS: 2.0000 | INHALATION_SPRAY | RESPIRATORY_TRACT | 11 refills | Status: AC | PRN

## 2023-01-20 MED ORDER — OMEPRAZOLE 40 MG PO CPDR: 40.0000 mg | DELAYED_RELEASE_CAPSULE | Freq: Every day | ORAL | 3 refills | Status: DC

## 2023-01-20 MED ORDER — SUMATRIPTAN SUCCINATE 100 MG PO TABS: ORAL_TABLET | ORAL | 11 refills | Status: DC

## 2023-01-20 MED ORDER — ATORVASTATIN CALCIUM 80 MG PO TABS: 80.0000 mg | ORAL_TABLET | Freq: Every day | ORAL | 3 refills | Status: DC

## 2023-01-20 NOTE — Progress Notes (Signed)
Patient ID: Warren Heller., male  DOB: February 08, 1965, 57 y.o.   MRN: [id number] Patient Care Team    Relationship Specialty Notifications Start End  Natalia Leatherwood, DO PCP - General Family Medicine  09/29/14   Letta Kocher, MD  Rehabilitation  12/09/16   Benancio Deeds, MD Consulting Physician Gastroenterology  01/17/18     Chief Complaint  Patient presents with   Annual Exam    Chronic condition management appointment. Pt is not fasting    Subjective: Warren Hall. is a 57 y.o. male present for cpe and Chronic Conditions/illness Management All past medical history, surgical history, allergies, family history, immunizations, medications and social history were updated in the electronic medical record today. All recent labs, ED visits and hospitalizations within the last year were reviewed.  Health maintenance:  Colonoscopy:06/2022- Dr. Adela Lank 5 yr recall due. Referral placed. He was encouraged to cal las well.  Immunizations: tdap UTD 2015, influenza UTD 2024, shingrix declined Infectious disease screening: HIV completed, Hep C completed PSA: no fhx. Psa collected today Lab Results  Component Value Date   PSA 2.69 01/17/2022   PSA 1.14 01/09/2020   PSA 1.97 01/17/2018    Hyperlipidemia: Patient reports compliance lipitor 80 qd.   GERD: Patient reports compliance with omeprazole 40 mg daily.  He has been unable to taper down on medication without return of symptoms.   Vitamin D, B12 and mag have been stable and he supplements vit d and b12.   Migraines: Still requiring use of imitrex PRN 1-3 (1/2 tab) a week. This has been chronic for him.       01/20/2023    1:06 PM 01/17/2022    1:03 PM 01/15/2021    1:05 PM 01/09/2020   10:36 AM 01/17/2018    1:15 PM  Depression screen PHQ 2/9  Decreased Interest 0 0 0 0 0  Down, Depressed, Hopeless 0 0 0 0 0  PHQ - 2 Score 0 0 0 0 0  Altered sleeping 0      Tired, decreased energy 0      Change in appetite 0       Feeling bad or failure about yourself  0      Trouble concentrating 0      Moving slowly or fidgety/restless 0      Suicidal thoughts 0      PHQ-9 Score 0          01/20/2023    1:06 PM  GAD 7 : Generalized Anxiety Score  Nervous, Anxious, on Edge 0  Control/stop worrying 0  Worry too much - different things 0  Trouble relaxing 0  Restless 0  Easily annoyed or irritable 0  Afraid - awful might happen 0  Total GAD 7 Score 0  Anxiety Difficulty Not difficult at all           01/20/2023    1:06 PM 11/18/2022    8:15 AM 01/09/2020   11:47 AM 12/07/2015    8:22 AM  Fall Risk   Falls in the past year? 0 0 0 No  Number falls in past yr: 0 0 0   Injury with Fall? 0 0 0   Risk for fall due to : No Fall Risks No Fall Risks    Follow up Falls evaluation completed Falls evaluation completed Falls evaluation completed     Immunization History  Administered Date(s) Administered   Influenza, Seasonal, Injecte, Preservative Fre 11/18/2022  Influenza,inj,Quad PF,6+ Mos 11/27/2013, 10/21/2014, 12/07/2015, 12/08/2016, 01/17/2018, 01/15/2021, 01/17/2022   Tdap 11/27/2013   Past Medical History:  Diagnosis Date   Allergy    Seasonal   COVID-19 07/01/2020   Frequent headaches    Hyperlipidemia    Lumbar herniated disc 2001   conservative treatment   Migraines    Seasonal asthma    No Known Allergies Past Surgical History:  Procedure Laterality Date   ACHILLES TENDON REPAIR     LASIK     SHOULDER SURGERY     Family History  Problem Relation Age of Onset   COPD Mother    Hypertension Father    Colon polyps Father    COPD Maternal Grandmother    Multiple sclerosis Maternal Grandfather    Cancer Paternal Grandfather        lung   Colon cancer Neg Hx    Esophageal cancer Neg Hx    Rectal cancer Neg Hx    Stomach cancer Neg Hx    Social History   Social History Narrative   Mr Stout lives with his daughter. He works Teacher, English as a foreign language.    Allergies as of 01/20/2023   No  Known Allergies      Medication List        Accurate as of January 20, 2023  1:25 PM. If you have any questions, ask your nurse or doctor.          STOP taking these medications    B-complex with vitamin C tablet Stopped by: Felix Pacini   fluticasone 50 MCG/ACT nasal spray Commonly known as: FLONASE Stopped by: Felix Pacini   ZyrTEC Allergy 10 MG tablet Generic drug: cetirizine Stopped by: Felix Pacini       TAKE these medications    albuterol 108 (90 Base) MCG/ACT inhaler Commonly known as: VENTOLIN HFA Inhale 2 puffs into the lungs every 4 (four) hours as needed for wheezing.   atorvastatin 80 MG tablet Commonly known as: LIPITOR Take 1 tablet (80 mg total) by mouth daily.   B-12 1000 MCG Caps Take 1 tablet by mouth daily.   cholecalciferol 25 MCG (1000 UNIT) tablet Commonly known as: VITAMIN D3 Take 1,000 Units by mouth daily.   omeprazole 40 MG capsule Commonly known as: PRILOSEC Take 1 capsule (40 mg total) by mouth daily.   SUMAtriptan 100 MG tablet Commonly known as: IMITREX May repeat in 2 hours if headache persists or recurs.   tiZANidine 4 MG tablet Commonly known as: Zanaflex Take 0.5-1 tablets (2-4 mg total) by mouth at bedtime. Started by: Felix Pacini       All past medical history, surgical history, allergies, family history, immunizations andmedications were updated in the EMR today and reviewed under the history and medication portions of their EMR.      ROS: 14 pt review of systems performed and negative (unless mentioned in an HPI)  Objective: BP 138/80   Pulse 75   Ht 6\' 1"  (1.854 m)   Wt 217 lb 3.2 oz (98.5 kg)   SpO2 96%   BMI 28.66 kg/m  Physical Exam Constitutional:      General: He is not in acute distress.    Appearance: Normal appearance. He is not ill-appearing, toxic-appearing or diaphoretic.  HENT:     Head: Normocephalic and atraumatic.     Right Ear: Tympanic membrane, ear canal and external ear  normal. There is no impacted cerumen.     Left Ear: Tympanic membrane, ear canal and external ear normal.  There is no impacted cerumen.     Nose: Nose normal. No congestion or rhinorrhea.     Mouth/Throat:     Mouth: Mucous membranes are moist.     Pharynx: Oropharynx is clear. No oropharyngeal exudate or posterior oropharyngeal erythema.  Eyes:     General: No scleral icterus.       Right eye: No discharge.        Left eye: No discharge.     Extraocular Movements: Extraocular movements intact.     Pupils: Pupils are equal, round, and reactive to light.  Cardiovascular:     Rate and Rhythm: Normal rate and regular rhythm.     Pulses: Normal pulses.     Heart sounds: Normal heart sounds. No murmur heard.    No friction rub. No gallop.  Pulmonary:     Effort: Pulmonary effort is normal. No respiratory distress.     Breath sounds: Normal breath sounds. No stridor. No wheezing, rhonchi or rales.  Chest:     Chest wall: No tenderness.  Abdominal:     General: Abdomen is flat. Bowel sounds are normal. There is no distension.     Palpations: Abdomen is soft. There is no mass.     Tenderness: There is no abdominal tenderness. There is no right CVA tenderness, left CVA tenderness, guarding or rebound.     Hernia: No hernia is present.  Musculoskeletal:        General: No swelling or tenderness. Normal range of motion.     Cervical back: Normal range of motion and neck supple.     Right lower leg: No edema.     Left lower leg: No edema.  Lymphadenopathy:     Cervical: No cervical adenopathy.  Skin:    General: Skin is warm and dry.     Coloration: Skin is not jaundiced.     Findings: No bruising, lesion or rash.  Neurological:     General: No focal deficit present.     Mental Status: He is alert and oriented to person, place, and time. Mental status is at baseline.     Cranial Nerves: No cranial nerve deficit.     Sensory: No sensory deficit.     Motor: No weakness.      Coordination: Coordination normal.     Gait: Gait normal.     Deep Tendon Reflexes: Reflexes normal.  Psychiatric:        Mood and Affect: Mood normal.        Behavior: Behavior normal.        Thought Content: Thought content normal.        Judgment: Judgment normal.     No results found.  Assessment/plan: Warren Hall. is a 57 y.o. male present for CPE and combined chronic condition Gastroesophageal reflux disease, esophagitis presence not specified/long-term PPI/vitamin D deficiency/B12 deficiency Continue prilosec 40 mg QD.  -Vitamin D, B12 and mag  > he is supplementing.levels normal w/ supplement   Hyperlipidemia, unspecified hyperlipidemia type/overweight -Dietary modifications  and exercise encouraged Continue lipitor 80 mg in the evening Lipids collected today -Continue fish oil supplementation  Migraine without aura and without status migrainosus, not intractable Stable Continue Imitrex as needed Discussed if his headaches worsen I would recommend neuro consult- he reports 8-12 headache days a month now, but this has been chronic for him and responds to 1/2 tab Imitrex. He currently declines referral .   Routine general medical examination at a health care facility Patient  was encouraged to exercise greater than 150 minutes a week. Patient was encouraged to choose a diet filled with fresh fruits and vegetables, and lean meats. AVS provided to patient today for education/recommendation on gender specific health and safety maintenance. Colonoscopy:2018- Dr. Adela Lank 5 yr recall due. Referral placed. He was encouraged to cal las well.  Immunizations: tdap UTD 2015, influenza UTD 2024, shingrix declined Infectious disease screening: HIV completed, Hep C completed PSA: no fhx. Psa collected today  Return in about 1 year (around 01/21/2024) for cpe (20 min), Routine chronic condition follow-up.  Orders Placed This Encounter  Procedures   Lipid panel   PSA   TSH    Meds ordered this encounter  Medications   atorvastatin (LIPITOR) 80 MG tablet    Sig: Take 1 tablet (80 mg total) by mouth daily.    Dispense:  90 tablet    Refill:  3   omeprazole (PRILOSEC) 40 MG capsule    Sig: Take 1 capsule (40 mg total) by mouth daily.    Dispense:  90 capsule    Refill:  3   albuterol (VENTOLIN HFA) 108 (90 Base) MCG/ACT inhaler    Sig: Inhale 2 puffs into the lungs every 4 (four) hours as needed for wheezing.    Dispense:  1 each    Refill:  11   SUMAtriptan (IMITREX) 100 MG tablet    Sig: May repeat in 2 hours if headache persists or recurs.    Dispense:  15 tablet    Refill:  11   tiZANidine (ZANAFLEX) 4 MG tablet    Sig: Take 0.5-1 tablets (2-4 mg total) by mouth at bedtime.    Dispense:  30 tablet    Refill:  1   Referral Orders  No referral(s) requested today      Note is dictated utilizing voice recognition software. Although note has been proof read prior to signing, occasional typographical errors still can be missed. If any questions arise, please do not hesitate to call for verification.  Electronically signed by: Felix Pacini, DO [facility] [place]

## 2023-01-20 NOTE — Patient Instructions (Addendum)
Return in about 1 year (around 01/21/2024) for cpe (20 min), Routine chronic condition follow-up.        Great to see you today.  I have refilled the medication(s) we provide.   If labs were collected or images ordered, we will inform you of  results once we have received them and reviewed. We will contact you either by echart message, or telephone call.  Please give ample time to the testing facility, and our office to run,  receive and review results. Please do not call inquiring of results, even if you can see them in your chart. We will contact you as soon as we are able. If it has been over 1 week since the test was completed, and you have not yet heard from [place], then please call us.    - echart message- for normal results that have been seen by the patient already.   - telephone call: abnormal results or if patient has not viewed results in their echart.  If a referral to a specialist was entered for you, please call us in 2 weeks if you have not heard from the specialist office to schedule.

## 2023-02-11 ENCOUNTER — Other Ambulatory Visit: Payer: Self-pay | Admitting: Family Medicine

## 2024-01-10 ENCOUNTER — Other Ambulatory Visit: Payer: Self-pay | Admitting: Family Medicine

## 2024-01-18 ENCOUNTER — Other Ambulatory Visit: Payer: Self-pay | Admitting: Family Medicine

## 2024-01-22 ENCOUNTER — Encounter: Payer: Self-pay | Admitting: Family Medicine

## 2024-01-22 ENCOUNTER — Ambulatory Visit: Payer: BC Managed Care – PPO | Admitting: Family Medicine

## 2024-01-22 VITALS — BP 128/78 | HR 85 | Temp 98.1°F | Ht 73.0 in | Wt 226.0 lb

## 2024-01-22 DIAGNOSIS — Z131 Encounter for screening for diabetes mellitus: Secondary | ICD-10-CM

## 2024-01-22 DIAGNOSIS — Z5181 Encounter for therapeutic drug level monitoring: Secondary | ICD-10-CM | POA: Diagnosis not present

## 2024-01-22 DIAGNOSIS — Z125 Encounter for screening for malignant neoplasm of prostate: Secondary | ICD-10-CM

## 2024-01-22 DIAGNOSIS — Z79899 Other long term (current) drug therapy: Secondary | ICD-10-CM | POA: Diagnosis not present

## 2024-01-22 DIAGNOSIS — K635 Polyp of colon: Secondary | ICD-10-CM

## 2024-01-22 DIAGNOSIS — K219 Gastro-esophageal reflux disease without esophagitis: Secondary | ICD-10-CM | POA: Diagnosis not present

## 2024-01-22 DIAGNOSIS — Z23 Encounter for immunization: Secondary | ICD-10-CM

## 2024-01-22 DIAGNOSIS — E785 Hyperlipidemia, unspecified: Secondary | ICD-10-CM | POA: Diagnosis not present

## 2024-01-22 DIAGNOSIS — E663 Overweight: Secondary | ICD-10-CM | POA: Diagnosis not present

## 2024-01-22 DIAGNOSIS — Z0000 Encounter for general adult medical examination without abnormal findings: Secondary | ICD-10-CM | POA: Diagnosis not present

## 2024-01-22 DIAGNOSIS — E559 Vitamin D deficiency, unspecified: Secondary | ICD-10-CM | POA: Diagnosis not present

## 2024-01-22 DIAGNOSIS — G43009 Migraine without aura, not intractable, without status migrainosus: Secondary | ICD-10-CM

## 2024-01-22 LAB — CBC WITH DIFFERENTIAL/PLATELET
Basophils Absolute: 0.1 K/uL (ref 0.0–0.1)
Basophils Relative: 0.6 % (ref 0.0–3.0)
Eosinophils Absolute: 0.2 K/uL (ref 0.0–0.7)
Eosinophils Relative: 2 % (ref 0.0–5.0)
HCT: 43.1 % (ref 39.0–52.0)
Hemoglobin: 15.1 g/dL (ref 13.0–17.0)
Lymphocytes Relative: 39 % (ref 12.0–46.0)
Lymphs Abs: 3.3 K/uL (ref 0.7–4.0)
MCHC: 35 g/dL (ref 30.0–36.0)
MCV: 93.4 fl (ref 78.0–100.0)
Monocytes Absolute: 0.7 K/uL (ref 0.1–1.0)
Monocytes Relative: 8.7 % (ref 3.0–12.0)
Neutro Abs: 4.2 K/uL (ref 1.4–7.7)
Neutrophils Relative %: 49.7 % (ref 43.0–77.0)
Platelets: 288 K/uL (ref 150.0–400.0)
RBC: 4.62 Mil/uL (ref 4.22–5.81)
RDW: 13.1 % (ref 11.5–15.5)
WBC: 8.4 K/uL (ref 4.0–10.5)

## 2024-01-22 LAB — LIPID PANEL
Cholesterol: 185 mg/dL (ref 28–200)
HDL: 41.8 mg/dL
LDL Cholesterol: 65 mg/dL (ref 10–99)
NonHDL: 143.2
Total CHOL/HDL Ratio: 4
Triglycerides: 392 mg/dL — ABNORMAL HIGH (ref 10.0–149.0)
VLDL: 78.4 mg/dL — ABNORMAL HIGH (ref 0.0–40.0)

## 2024-01-22 LAB — COMPREHENSIVE METABOLIC PANEL WITH GFR
ALT: 25 U/L (ref 3–53)
AST: 22 U/L (ref 5–37)
Albumin: 4.7 g/dL (ref 3.5–5.2)
Alkaline Phosphatase: 54 U/L (ref 39–117)
BUN: 17 mg/dL (ref 6–23)
CO2: 27 meq/L (ref 19–32)
Calcium: 9.8 mg/dL (ref 8.4–10.5)
Chloride: 103 meq/L (ref 96–112)
Creatinine, Ser: 1.21 mg/dL (ref 0.40–1.50)
GFR: 66.48 mL/min
Glucose, Bld: 76 mg/dL (ref 70–99)
Potassium: 4.2 meq/L (ref 3.5–5.1)
Sodium: 139 meq/L (ref 135–145)
Total Bilirubin: 0.8 mg/dL (ref 0.2–1.2)
Total Protein: 6.7 g/dL (ref 6.0–8.3)

## 2024-01-22 LAB — VITAMIN D 25 HYDROXY (VIT D DEFICIENCY, FRACTURES): VITD: 25.48 ng/mL — ABNORMAL LOW (ref 30.00–100.00)

## 2024-01-22 LAB — TSH: TSH: 1.6 u[IU]/mL (ref 0.35–5.50)

## 2024-01-22 LAB — PSA: PSA: 4.44 ng/mL — ABNORMAL HIGH (ref 0.10–4.00)

## 2024-01-22 LAB — HEMOGLOBIN A1C: Hgb A1c MFr Bld: 5.4 % (ref 4.6–6.5)

## 2024-01-22 LAB — MAGNESIUM: Magnesium: 2 mg/dL (ref 1.5–2.5)

## 2024-01-22 LAB — VITAMIN B12: Vitamin B-12: 461 pg/mL (ref 211–911)

## 2024-01-22 MED ORDER — ATORVASTATIN CALCIUM 80 MG PO TABS: 80.0000 mg | ORAL_TABLET | Freq: Every day | ORAL | 3 refills | Status: AC

## 2024-01-22 MED ORDER — SUMATRIPTAN SUCCINATE 100 MG PO TABS: ORAL_TABLET | ORAL | 11 refills | Status: AC

## 2024-01-22 MED ORDER — OMEPRAZOLE 40 MG PO CPDR: 40.0000 mg | DELAYED_RELEASE_CAPSULE | Freq: Every day | ORAL | 3 refills | Status: AC

## 2024-01-22 NOTE — Progress Notes (Signed)
 "    Patient ID: Warren Hall., male  DOB: [birth year], 58 y.o.   MRN: [id number] Patient Care Team    Relationship Specialty Notifications Start End  Catherine Charlies LABOR, DO PCP - General Family Medicine  09/29/14   Darlean Ned, MD  Rehabilitation  12/09/16   Leigh Elspeth SQUIBB, MD Consulting Physician Gastroenterology  01/17/18     Chief Complaint  Patient presents with   Annual Exam    Chronic Conditions/illness Management Tdap Influenza    Subjective: Warren Hall. is a 58 y.o. male present for cpe and Chronic Conditions/illness Management All past medical history, surgical history, allergies, family history, immunizations, medications and social history were updated in the electronic medical record today. All recent labs, ED visits and hospitalizations within the last year were reviewed.  Health maintenance:  Colonoscopy:06/2022- Dr. Cyndi 5 yr recall Immunizations: tdap due-completed today, influenza-completed today, shingrix declined.  Discussed Prevnar and hepatitis B vaccines. Infectious disease screening: HIV completed, Hep C completed PSA: no fhx. Psa collected today Lab Results  Component Value Date   PSA 3.04 01/20/2023   PSA 2.69 01/17/2022   PSA 1.14 01/09/2020    Hyperlipidemia: Patient reports compliance lipitor 80 qd.   GERD: Patient reports compliance with omeprazole  40 mg daily.  He has been unable to taper down on medication without return of symptoms.   Vitamin D , B12 and mag have been stable and he supplements vit d and b12.   Migraines: Still requiring use of imitrex  PRN 1-3 (1/2 tab) a week. This has been chronic for him.  Condition stable, no complaints     01/22/2024    2:13 PM 01/20/2023    1:06 PM 01/17/2022    1:03 PM 01/15/2021    1:05 PM 01/09/2020   10:36 AM  Depression screen PHQ 2/9  Decreased Interest 0 0 0 0 0  Down, Depressed, Hopeless 0 0 0 0 0  PHQ - 2 Score 0 0 0 0 0  Altered sleeping  0     Tired, decreased  energy  0     Change in appetite  0     Feeling bad or failure about yourself   0     Trouble concentrating  0     Moving slowly or fidgety/restless  0     Suicidal thoughts  0     PHQ-9 Score  0         Data saved with a previous flowsheet row definition      01/20/2023    1:06 PM  GAD 7 : Generalized Anxiety Score  Nervous, Anxious, on Edge 0  Control/stop worrying 0  Worry too much - different things 0  Trouble relaxing 0  Restless 0  Easily annoyed or irritable 0  Afraid - awful might happen 0  Total GAD 7 Score 0  Anxiety Difficulty Not difficult at all           01/22/2024    2:13 PM 01/20/2023    1:06 PM 11/18/2022    8:15 AM 01/09/2020   11:47 AM 12/07/2015    8:22 AM  Fall Risk   Falls in the past year? 0 0 0 0 No   Number falls in past yr: 0 0 0 0   Injury with Fall? 0 0  0  0    Risk for fall due to : No Fall Risks No Fall Risks No Fall Risks    Follow up Falls evaluation completed  Falls evaluation completed Falls evaluation completed Falls evaluation completed       Data saved with a previous flowsheet row definition    Immunization History  Administered Date(s) Administered   Influenza, Seasonal, Injecte, Preservative Fre 11/18/2022, 01/22/2024   Influenza,inj,Quad PF,6+ Mos 11/27/2013, 10/21/2014, 12/07/2015, 12/08/2016, 01/17/2018, 01/15/2021, 01/17/2022   Tdap 11/27/2013, 01/22/2024   Past Medical History:  Diagnosis Date   Allergy     Seasonal   COVID-19 07/01/2020   Frequent headaches    Hyperlipidemia    Lumbar herniated disc 2001   conservative treatment   Migraines    Osteoarthritis of right hip 11/01/2022   Seasonal asthma    No Known Allergies Past Surgical History:  Procedure Laterality Date   ACHILLES TENDON REPAIR     LASIK     SHOULDER SURGERY     Family History  Problem Relation Age of Onset   COPD Mother    Hypertension Father    Colon polyps Father    COPD Maternal Grandmother    Multiple sclerosis Maternal  Grandfather    Cancer Paternal Grandfather        lung   Colon cancer Neg Hx    Esophageal cancer Neg Hx    Rectal cancer Neg Hx    Stomach cancer Neg Hx    Social History   Social History Narrative   Warren Hall lives with his daughter. He works TEACHER, [employer].    Allergies as of 01/22/2024   No Known Allergies      Medication List        Accurate as of January 22, 2024  2:19 PM. If you have any questions, ask your nurse or doctor.          STOP taking these medications    tiZANidine  4 MG tablet Commonly known as: Zanaflex  Stopped by: Charlies Bellini, DO       TAKE these medications    albuterol  108 (90 Base) MCG/ACT inhaler Commonly known as: VENTOLIN  HFA Inhale 2 puffs into the lungs every 4 (four) hours as needed for wheezing.   atorvastatin  80 MG tablet Commonly known as: LIPITOR Take 1 tablet (80 mg total) by mouth daily.   B-12 1000 MCG Caps Take 1 tablet by mouth daily.   cholecalciferol 25 MCG (1000 UNIT) tablet Commonly known as: VITAMIN D3 Take 1,000 Units by mouth daily.   omeprazole  40 MG capsule Commonly known as: PRILOSEC Take 1 capsule (40 mg total) by mouth daily.   SUMAtriptan  100 MG tablet Commonly known as: IMITREX  May repeat in 2 hours if headache persists or recurs.       All past medical history, surgical history, allergies, family history, immunizations andmedications were updated in the EMR today and reviewed under the history and medication portions of their EMR.      ROS: 14 pt review of systems performed and negative (unless mentioned in an HPI)  Objective: BP 128/78   Pulse 85   Temp 98.1 F (36.7 C)   Ht 6' 1 (1.854 m)   Wt 226 lb (102.5 kg)   SpO2 98%   BMI 29.82 kg/m  Physical Exam Constitutional:      General: He is not in acute distress.    Appearance: Normal appearance. He is not ill-appearing, toxic-appearing or diaphoretic.  HENT:     Head: Normocephalic and atraumatic.     Right Ear: Tympanic membrane, ear  canal and external ear normal. There is no impacted cerumen.     Left Ear: Tympanic  membrane, ear canal and external ear normal. There is no impacted cerumen.     Nose: Nose normal. No congestion or rhinorrhea.     Mouth/Throat:     Mouth: Mucous membranes are moist.     Pharynx: Oropharynx is clear. No oropharyngeal exudate or posterior oropharyngeal erythema.  Eyes:     General: No scleral icterus.       Right eye: No discharge.        Left eye: No discharge.     Extraocular Movements: Extraocular movements intact.     Pupils: Pupils are equal, round, and reactive to light.  Cardiovascular:     Rate and Rhythm: Normal rate and regular rhythm.     Pulses: Normal pulses.     Heart sounds: Normal heart sounds. No murmur heard.    No friction rub. No gallop.  Pulmonary:     Effort: Pulmonary effort is normal. No respiratory distress.     Breath sounds: Normal breath sounds. No stridor. No wheezing, rhonchi or rales.  Chest:     Chest wall: No tenderness.  Abdominal:     General: Abdomen is flat. Bowel sounds are normal. There is no distension.     Palpations: Abdomen is soft. There is no mass.     Tenderness: There is no abdominal tenderness. There is no right CVA tenderness, left CVA tenderness, guarding or rebound.     Hernia: No hernia is present.  Musculoskeletal:        General: No swelling or tenderness. Normal range of motion.     Cervical back: Normal range of motion and neck supple.     Right lower leg: No edema.     Left lower leg: No edema.  Lymphadenopathy:     Cervical: No cervical adenopathy.  Skin:    General: Skin is warm and dry.     Coloration: Skin is not jaundiced.     Findings: No bruising, lesion or rash.  Neurological:     General: No focal deficit present.     Mental Status: He is alert and oriented to person, place, and time. Mental status is at baseline.     Cranial Nerves: No cranial nerve deficit.     Sensory: No sensory deficit.     Motor: No  weakness.     Coordination: Coordination normal.     Gait: Gait normal.     Deep Tendon Reflexes: Reflexes normal.  Psychiatric:        Mood and Affect: Mood normal.        Behavior: Behavior normal.        Thought Content: Thought content normal.        Judgment: Judgment normal.     No results found.  Assessment/plan: Jeryl Umholtz. is a 58 y.o. male present for CPE and combined chronic condition Gastroesophageal reflux disease, esophagitis presence not specified/long-term PPI/vitamin D  deficiency/B12 deficiency Stable Continue prilosec 40 mg QD.  -Vitamin D , B12 and mag levels collected today   Hyperlipidemia, unspecified hyperlipidemia type/overweight -Dietary modifications  and exercise encouraged Continue lipitor 80 mg in the evening Lipids collected today -Continue fish oil supplementation  Migraine without aura and without status migrainosus, not intractable Stable Continue Imitrex  as needed Discussed if his headaches worsen I would recommend neuro consult- he reports 8-12 headache days a month now, but this has been chronic for him and responds to 1/2 tab Imitrex . He currently declines referral .   Routine general medical examination at a health  care facility Colonoscopy:06/2022- Dr. Cyndi 5 yr recall Immunizations: tdap due-completed today, influenza-completed today, shingrix declined.  Discussed Prevnar and hepatitis B vaccines. Infectious disease screening: HIV completed, Hep C completed PSA: no fhx. Psa collected today Patient was encouraged to exercise greater than 150 minutes a week. Patient was encouraged to choose a diet filled with fresh fruits and vegetables, and lean meats. AVS provided to patient today for education/recommendation on gender specific health and safety maintenance.   Return in about 1 year (around 01/22/2025) for cpe (20 min), Routine chronic condition follow-up.  Orders Placed This Encounter  Procedures   Flu vaccine trivalent  PF, 6mos and older(Flulaval,Afluria,Fluarix,Fluzone)   Tdap vaccine greater than or equal to 7yo IM   CBC with Differential/Platelet   Comprehensive metabolic panel with GFR   Lipid panel   TSH   PSA   Vitamin D  (25 hydroxy)   B12   Magnesium   Hemoglobin A1c   Meds ordered this encounter  Medications   atorvastatin  (LIPITOR) 80 MG tablet    Sig: Take 1 tablet (80 mg total) by mouth daily.    Dispense:  90 tablet    Refill:  3   omeprazole  (PRILOSEC) 40 MG capsule    Sig: Take 1 capsule (40 mg total) by mouth daily.    Dispense:  90 capsule    Refill:  3   SUMAtriptan  (IMITREX ) 100 MG tablet    Sig: May repeat in 2 hours if headache persists or recurs.    Dispense:  15 tablet    Refill:  11   Referral Orders  No referral(s) requested today      Note is dictated utilizing voice recognition software. Although note has been proof read prior to signing, occasional typographical errors still can be missed. If any questions arise, please do not hesitate to call for verification.  Electronically signed by: Charlies Bellini, DO [facility] [place]   "

## 2024-01-22 NOTE — Patient Instructions (Addendum)

## 2024-01-23 ENCOUNTER — Ambulatory Visit: Payer: Self-pay | Admitting: Family Medicine

## 2024-01-23 DIAGNOSIS — R972 Elevated prostate specific antigen [PSA]: Secondary | ICD-10-CM | POA: Insufficient documentation

## 2024-01-23 DIAGNOSIS — E559 Vitamin D deficiency, unspecified: Secondary | ICD-10-CM

## 2024-03-05 NOTE — Progress Notes (Unsigned)
" ° ° °  Chief Complaint: Abnormal PSA  History of Present Illness:  59 year old male sent by Dr. Catherine for evaluation and management of elevated PSA.  Prior PSA data: 10/2013--0.93 12/2014--0.79 12/2017--1.97 01/2020--1.14 12/2021--2.69 01/2023--3.04 01/2024--4.44  Past Medical History:  Past Medical History:  Diagnosis Date   Allergy     Seasonal   COVID-19 07/01/2020   Frequent headaches    Hyperlipidemia    Lumbar herniated disc 2001   conservative treatment   Migraines    Osteoarthritis of right hip 11/01/2022   Seasonal asthma     Past Surgical History:  Past Surgical History:  Procedure Laterality Date   ACHILLES TENDON REPAIR     LASIK     SHOULDER SURGERY      Allergies:  Allergies[1]  Family History:  Family History  Problem Relation Age of Onset   COPD Mother    Hypertension Father    Colon polyps Father    COPD Maternal Grandmother    Multiple sclerosis Maternal Grandfather    Cancer Paternal Grandfather        lung   Colon cancer Neg Hx    Esophageal cancer Neg Hx    Rectal cancer Neg Hx    Stomach cancer Neg Hx     Social History:  Social History[2]  Review of symptoms:  Constitutional:  Negative for unexplained weight loss, night sweats, fever, chills ENT:  Negative for nose bleeds, sinus pain, painful swallowing CV:  Negative for chest pain, shortness of breath, exercise intolerance, palpitations, loss of consciousness Resp:  Negative for cough, wheezing, shortness of breath GI:  Negative for nausea, vomiting, diarrhea, bloody stools GU:  Positives noted in HPI; otherwise negative for gross hematuria, dysuria, urinary incontinence Neuro:  Negative for seizures, poor balance, limb weakness, slurred speech Psych:  Negative for lack of energy, depression, anxiety Endocrine:  Negative for polydipsia, polyuria, symptoms of hypoglycemia (dizziness, hunger, sweating) Hematologic:  Negative for anemia, purpura, petechia, prolonged or excessive  bleeding, use of anticoagulants  Allergic:  Negative for difficulty breathing or choking as a result of exposure to anything; no shellfish allergy ; no allergic response (rash/itch) to materials, foods  Physical exam: There were no vitals taken for this visit. GENERAL APPEARANCE:  Well appearing, well developed, well nourished, NAD HEENT: Atraumatic, Normocephalic. NECK: Normal appearance LUNGS: Normal inspiratory and expiratory excursion HEART: Regular Rate ABDOMEN: ***. GU: Phallus normal, no lesions. Scrotal skin normal. Testicles/epididymal structures normal. Meatus normal. Normal anal sphincter tone, prostate ***mL, symmetric, non nodular, non tender. EXTREMITIES: Moves all extremities well.  Without clubbing, cyanosis, or edema. NEUROLOGIC:  Alert and oriented x 3, normal gait, CN II-XII grossly intact.  MENTAL STATUS:  Appropriate. SKIN:  Warm, dry and intact.    Results: No results found for this or any previous visit (from the past 24 hours).  I have reviewed referring/prior physicians notes  I have reviewed urinalysis  I have reviewed PSA results  I have reviewed prior imaging  I have reviewed urine culture results  Assessment: ***   Plan: ***     [1] No Known Allergies [2]  Social History Tobacco Use   Smoking status: Former   Smokeless tobacco: Never   Tobacco comments:    on and off 30 years ago  Vaping Use   Vaping status: Never Used  Substance Use Topics   Alcohol use: Yes    Comment: weekly   Drug use: No   "

## 2024-03-06 ENCOUNTER — Ambulatory Visit: Admitting: Urology

## 2024-03-06 ENCOUNTER — Encounter: Payer: Self-pay | Admitting: Urology

## 2024-03-06 VITALS — BP 146/89 | HR 64 | Ht 73.0 in | Wt 224.8 lb

## 2024-03-06 DIAGNOSIS — R972 Elevated prostate specific antigen [PSA]: Secondary | ICD-10-CM | POA: Diagnosis not present

## 2024-03-06 LAB — MICROSCOPIC EXAMINATION

## 2024-03-06 LAB — URINALYSIS, ROUTINE W REFLEX MICROSCOPIC
Bilirubin, UA: NEGATIVE
Glucose, UA: NEGATIVE
Leukocytes,UA: NEGATIVE
Nitrite, UA: NEGATIVE
Protein,UA: NEGATIVE
RBC, UA: NEGATIVE
Specific Gravity, UA: 1.025 (ref 1.005–1.030)
Urobilinogen, Ur: 1 mg/dL (ref 0.2–1.0)
pH, UA: 5.5 (ref 5.0–7.5)

## 2024-03-07 ENCOUNTER — Other Ambulatory Visit: Payer: Self-pay | Admitting: Urology

## 2024-03-07 DIAGNOSIS — R972 Elevated prostate specific antigen [PSA]: Secondary | ICD-10-CM

## 2024-04-09 ENCOUNTER — Other Ambulatory Visit

## 2025-01-22 ENCOUNTER — Encounter: Admitting: Family Medicine
